# Patient Record
Sex: Female | Born: 1995 | Race: Black or African American | Hispanic: No | Marital: Married | Smoking: Never smoker
Health system: Southern US, Community
[De-identification: ages and names within clinical notes are randomized; demographics above are authoritative.]

## PROBLEM LIST (undated history)

## (undated) DIAGNOSIS — R102 Pelvic and perineal pain unspecified side: Secondary | ICD-10-CM

## (undated) DIAGNOSIS — K2970 Gastritis, unspecified, without bleeding: Secondary | ICD-10-CM

## (undated) DIAGNOSIS — K219 Gastro-esophageal reflux disease without esophagitis: Secondary | ICD-10-CM

## (undated) DIAGNOSIS — N946 Dysmenorrhea, unspecified: Secondary | ICD-10-CM

## (undated) DIAGNOSIS — K760 Fatty (change of) liver, not elsewhere classified: Secondary | ICD-10-CM

## (undated) DIAGNOSIS — Z309 Encounter for contraceptive management, unspecified: Secondary | ICD-10-CM

## (undated) DIAGNOSIS — N926 Irregular menstruation, unspecified: Secondary | ICD-10-CM

## (undated) HISTORY — DX: Fatty (change of) liver, not elsewhere classified: K76.0

## (undated) HISTORY — DX: Pelvic and perineal pain: R10.2

## (undated) HISTORY — DX: Pelvic and perineal pain unspecified side: R10.20

## (undated) HISTORY — DX: Gastro-esophageal reflux disease without esophagitis: K21.9

## (undated) HISTORY — DX: Dysmenorrhea, unspecified: N94.6

## (undated) HISTORY — DX: Irregular menstruation, unspecified: N92.6

## (undated) HISTORY — PX: NO PAST SURGERIES: SHX2092

## (undated) HISTORY — DX: Encounter for contraceptive management, unspecified: Z30.9

---
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
## 2014-04-24 ENCOUNTER — Emergency Department (HOSPITAL_COMMUNITY)
Admission: EM | Admit: 2014-04-24 | Discharge: 2014-04-24 | Disposition: A | Discharge status: Home / Self Care | Payer: 59 | Source: Home / Self Care | Attending: Family Medicine | Admitting: Family Medicine

## 2014-04-24 ENCOUNTER — Encounter (HOSPITAL_COMMUNITY): Payer: Self-pay | Admitting: Emergency Medicine

## 2014-04-24 DIAGNOSIS — R258 Other abnormal involuntary movements: Secondary | ICD-10-CM

## 2014-04-24 DIAGNOSIS — R519 Headache, unspecified: Secondary | ICD-10-CM

## 2014-04-24 DIAGNOSIS — R591 Generalized enlarged lymph nodes: Secondary | ICD-10-CM

## 2014-04-24 DIAGNOSIS — R251 Tremor, unspecified: Secondary | ICD-10-CM

## 2014-04-24 DIAGNOSIS — H1010 Acute atopic conjunctivitis, unspecified eye: Secondary | ICD-10-CM

## 2014-04-24 DIAGNOSIS — J302 Other seasonal allergic rhinitis: Secondary | ICD-10-CM

## 2014-04-24 DIAGNOSIS — R51 Headache: Secondary | ICD-10-CM

## 2014-04-24 LAB — CBC WITH DIFFERENTIAL/PLATELET
Basophils Absolute: 0 10*3/uL (ref 0.0–0.1)
Basophils Relative: 1 % (ref 0–1)
Eosinophils Absolute: 0.2 10*3/uL (ref 0.0–0.7)
Eosinophils Relative: 3 % (ref 0–5)
HCT: 40.4 % (ref 36.0–46.0)
Hemoglobin: 13.9 g/dL (ref 12.0–15.0)
Lymphocytes Relative: 33 % (ref 12–46)
Lymphs Abs: 2.2 10*3/uL (ref 0.7–4.0)
MCH: 29.6 pg (ref 26.0–34.0)
MCHC: 34.4 g/dL (ref 30.0–36.0)
MCV: 86 fL (ref 78.0–100.0)
Monocytes Absolute: 0.5 10*3/uL (ref 0.1–1.0)
Monocytes Relative: 7 % (ref 3–12)
Neutro Abs: 3.8 10*3/uL (ref 1.7–7.7)
Neutrophils Relative %: 56 % (ref 43–77)
Platelets: 388 10*3/uL (ref 150–400)
RBC: 4.7 MIL/uL (ref 3.87–5.11)
RDW: 13.6 % (ref 11.5–15.5)
WBC: 6.7 10*3/uL (ref 4.0–10.5)

## 2014-04-24 MED ORDER — OLOPATADINE HCL 0.2 % OP SOLN: OPHTHALMIC | Status: DC

## 2014-04-24 MED ORDER — FLUTICASONE PROPIONATE 50 MCG/ACT NA SUSP: 2.0000 | Freq: Two times a day (BID) | NASAL | Status: DC

## 2014-04-24 MED ORDER — FEXOFENADINE-PSEUDOEPHED ER 60-120 MG PO TB12: 1.0000 | ORAL_TABLET | Freq: Two times a day (BID) | ORAL | Status: DC

## 2014-04-24 NOTE — ED Notes (Addendum)
Pt  Has    Multiple  Complaints    She  Reports  A  Knot  In  Front  Of  r  Ear   As  Well  As   weakness  r  Arm         And  Pain l side  Head    She  Reports  These symptoms  For  Over 1 month    She  Reports   -    As   Well   That       She  Has  A  Red  Irritated r  Eye andher  Period is  irreg   But  She  Has  Been abstinent

## 2014-04-24 NOTE — ED Provider Notes (Signed)
CSN: [id number]     Arrival date & time 04/24/14  1500 History   None    Chief Complaint  Patient presents with  . Extremity Weakness   (Consider location/radiation/quality/duration/timing/severity/associated sxs/prior Treatment) HPI        18 year old female presents for evaluation of a tender knot in the front of her right ear for 1 month, intermittent headaches in the left parietal area for 1 month, and a strange feeling in her right arm for 2 months. She describes feeling in her arm as being somewhat like a tremor but on the inside of her arm, and feeling like it is not actually. The knot has been persistent, every day, in front of the right ear. It has gotten slightly larger in size. No pain in the ear itself. No sore throat, cough. She has had some congestion. Also she complains of right eye irritation for about a week and a half. She describes this as feeling like her eyes are slightly burning and dry. She tried treating this with Visine drops without relief. She does have a doctor, she did not think to go there with this problem.  History reviewed. No pertinent past medical history. History reviewed. No pertinent past surgical history. History reviewed. No pertinent family history. History  Substance Use Topics  . Smoking status: Never Smoker   . Smokeless tobacco: Not on file  . Alcohol Use: No   OB History   Grav Para Term Preterm Abortions TAB SAB Ect Mult Living                 Review of Systems  Constitutional: Negative for fever and chills.  HENT: Positive for congestion, rhinorrhea and sore throat.   Eyes: Positive for pain, redness and itching. Negative for discharge and visual disturbance.  Respiratory: Positive for cough.   Musculoskeletal:       Strange feeling in the right arm  Neurological: Positive for tremors, numbness and headaches.  Hematological: Positive for adenopathy.  All other systems reviewed and are negative.   Allergies  Review of patient's  allergies indicates no known allergies.  Home Medications   Prior to Admission medications   Medication Sig Start Date End Date Taking? Authorizing Provider  cephALEXin (KEFLEX) 500 MG capsule Take 1 capsule (500 mg total) by mouth 4 (four) times daily. 01/15/13   Adlih Moreno-Coll, MD  fexofenadine-pseudoephedrine (ALLEGRA-D) 60-120 MG per tablet Take 1 tablet by mouth every 12 (twelve) hours. 04/24/14   Adrian BlackwaterZachary H Mahrukh Seguin, PA-C  fluticasone (FLONASE) 50 MCG/ACT nasal spray Place 2 sprays into both nostrils daily. 08/23/13   Cathlyn ParsonsAngela M Kabbe, NP  fluticasone (FLONASE) 50 MCG/ACT nasal spray Place 2 sprays into both nostrils 2 (two) times daily. Decrease to 2 sprays/nostril daily after 5 days 04/24/14   Graylon GoodZachary H Jaanai Salemi, PA-C  Olopatadine HCl (PATADAY) 0.2 % SOLN 1 drop per eye once daily as needed for redness, itching, or irritation 04/24/14   Graylon GoodZachary H Unique Searfoss, PA-C  phenazopyridine (PYRIDIUM) 200 MG tablet Take 1 tablet (200 mg total) by mouth 3 (three) times daily. 01/15/13   Adlih Moreno-Coll, MD   BP 135/66  Pulse 118  Temp(Src) 98.5 F (36.9 C) (Oral)  Resp 16  SpO2 98%  LMP 02/24/2014 Physical Exam  Nursing note and vitals reviewed. Constitutional: She is oriented to person, place, and time. Vital signs are normal. She appears well-developed and well-nourished. No distress.  HENT:  Head: Normocephalic and atraumatic.  Right Ear: External ear normal.  Left Ear: External  ear normal.  Nose: Nose normal.  Mouth/Throat: Oropharynx is clear and moist. No oropharyngeal exudate.  Eyes: Right eye exhibits no discharge. Left eye exhibits no discharge. Right conjunctiva is injected. Left conjunctiva is not injected.  Neck: Normal range of motion. Neck supple.  Cardiovascular: Regular rhythm, normal heart sounds and intact distal pulses.  Tachycardia present.   Pulse rechecked manually, was 104 bpm  Pulmonary/Chest: Effort normal and breath sounds normal. No respiratory distress. She has no  wheezes. She has no rales.  Abdominal: Soft. She exhibits no mass. There is no tenderness. There is no rebound and no guarding.  Lymphadenopathy:    She has cervical adenopathy (right tonsillar, right anterior auricular).  Neurological: She is alert and oriented to person, place, and time. She has normal strength. Coordination normal.  Skin: Skin is warm and dry. No rash noted. She is not diaphoretic.  Psychiatric: She has a normal mood and affect. Judgment normal.    ED Course  Procedures (including critical care time) Labs Review Labs Reviewed  CBC WITH DIFFERENTIAL    Imaging Review No results found.   MDM   1. Lymphadenopathy   2. Allergic conjunctivitis, unspecified laterality   3. Headache disorder   4. Occasional tremors   5. Seasonal allergies    No leukocytosis. We'll treat for allergies. Follow-up with primary care for evaluation of the tremor.   Meds ordered this encounter  Medications  . Olopatadine HCl (PATADAY) 0.2 % SOLN    Sig: 1 drop per eye once daily as needed for redness, itching, or irritation    Dispense:  2.5 mL    Refill:  0    Order Specific Question:  Supervising Provider    Answer:  Linna HoffKINDL, JAMES D [phone][[id number]]  . fluticasone (FLONASE) 50 MCG/ACT nasal spray    Sig: Place 2 sprays into both nostrils 2 (two) times daily. Decrease to 2 sprays/nostril daily after 5 days    Dispense:  16 g    Refill:  2    Order Specific Question:  Supervising Provider    Answer:  Linna HoffKINDL, JAMES D [phone][[id number]]  . fexofenadine-pseudoephedrine (ALLEGRA-D) 60-120 MG per tablet    Sig: Take 1 tablet by mouth every 12 (twelve) hours.    Dispense:  30 tablet    Refill:  0    Order Specific Question:  Supervising Provider    Answer:  Bradd CanaryKINDL, JAMES D [[id number]]       Graylon GoodZachary H Laurier Jasperson, PA-C 04/24/14 [id number]

## 2014-04-24 NOTE — ED Provider Notes (Signed)
Medical screening examination/treatment/procedure(s) were performed by resident physician or non-physician practitioner and as supervising physician I was immediately available for consultation/collaboration.   Barkley BrunsKINDL,Golden Gilreath DOUGLAS MD.   Linna HoffJames D Saverio Kader, MD 04/24/14 [phone]

## 2014-04-24 NOTE — Discharge Instructions (Signed)

## 2015-06-07 ENCOUNTER — Ambulatory Visit (HOSPITAL_COMMUNITY)
Admission: RE | Admit: 2015-06-07 | Discharge: 2015-06-07 | Disposition: A | Discharge status: Home / Self Care | Payer: 59 | Source: Ambulatory Visit | Attending: Family Medicine | Admitting: Family Medicine

## 2015-06-07 ENCOUNTER — Ambulatory Visit (HOSPITAL_COMMUNITY): Payer: 59

## 2015-06-07 ENCOUNTER — Other Ambulatory Visit (HOSPITAL_COMMUNITY): Payer: Self-pay | Admitting: Family Medicine

## 2015-06-07 DIAGNOSIS — N83201 Unspecified ovarian cyst, right side: Secondary | ICD-10-CM

## 2015-06-08 ENCOUNTER — Ambulatory Visit (HOSPITAL_COMMUNITY): Admission: RE | Admit: 2015-06-08 | Payer: 59 | Source: Ambulatory Visit

## 2015-06-08 ENCOUNTER — Ambulatory Visit (HOSPITAL_COMMUNITY): Payer: 59

## 2015-06-08 ENCOUNTER — Ambulatory Visit (HOSPITAL_COMMUNITY)
Admission: RE | Admit: 2015-06-08 | Discharge: 2015-06-08 | Disposition: A | Discharge status: Home / Self Care | Payer: 59 | Source: Ambulatory Visit | Attending: Family Medicine | Admitting: Family Medicine

## 2015-06-08 DIAGNOSIS — N83201 Unspecified ovarian cyst, right side: Secondary | ICD-10-CM | POA: Diagnosis not present

## 2015-06-08 DIAGNOSIS — R1031 Right lower quadrant pain: Secondary | ICD-10-CM | POA: Insufficient documentation

## 2015-07-30 DIAGNOSIS — Z6839 Body mass index (BMI) 39.0-39.9, adult: Secondary | ICD-10-CM | POA: Diagnosis not present

## 2015-07-30 DIAGNOSIS — E6609 Other obesity due to excess calories: Secondary | ICD-10-CM | POA: Diagnosis not present

## 2015-08-10 ENCOUNTER — Encounter: Payer: Self-pay | Admitting: Adult Health

## 2015-08-10 ENCOUNTER — Encounter: Payer: Self-pay | Admitting: *Deleted

## 2015-08-10 ENCOUNTER — Ambulatory Visit (INDEPENDENT_AMBULATORY_CARE_PROVIDER_SITE_OTHER): Payer: 59 | Admitting: Adult Health

## 2015-08-10 VITALS — BP 140/78 | HR 94 | Ht 63.25 in | Wt 228.0 lb

## 2015-08-10 DIAGNOSIS — N946 Dysmenorrhea, unspecified: Secondary | ICD-10-CM

## 2015-08-10 DIAGNOSIS — N926 Irregular menstruation, unspecified: Secondary | ICD-10-CM | POA: Diagnosis not present

## 2015-08-10 DIAGNOSIS — Z3202 Encounter for pregnancy test, result negative: Secondary | ICD-10-CM

## 2015-08-10 HISTORY — DX: Irregular menstruation, unspecified: N92.6

## 2015-08-10 HISTORY — DX: Dysmenorrhea, unspecified: N94.6

## 2015-08-10 LAB — POCT URINE PREGNANCY: Preg Test, Ur: NEGATIVE

## 2015-08-10 MED ORDER — NORETHIN-ETH ESTRAD-FE BIPHAS 1 MG-10 MCG / 10 MCG PO TABS: 1.0000 | ORAL_TABLET | Freq: Every day | ORAL | Status: DC

## 2015-08-10 NOTE — Patient Instructions (Signed)
Oral Contraception Use Oral contraceptive pills (OCPs) are medicines taken to prevent pregnancy. OCPs work by preventing the ovaries from releasing eggs. The hormones in OCPs also cause the cervical mucus to thicken, preventing the sperm from entering the uterus. The hormones also cause the uterine lining to become thin, not allowing a fertilized egg to attach to the inside of the uterus. OCPs are highly effective when taken exactly as prescribed. However, OCPs do not prevent sexually transmitted diseases (STDs). Safe sex practices, such as using condoms along with an OCP, can help prevent STDs. Before taking OCPs, you may have a physical exam and Pap test. Your health care provider may also order blood tests if necessary. Your health care provider will make sure you are a good candidate for oral contraception. Discuss with your health care provider the possible side effects of the OCP you may be prescribed. When starting an OCP, it can take 2 to 3 months for the body to adjust to the changes in hormone levels in your body.  HOW TO TAKE ORAL CONTRACEPTIVE PILLS Your health care provider may advise you on how to start taking the first cycle of OCPs. Otherwise, you can:   Start on day 1 of your menstrual period. You will not need any backup contraceptive protection with this start time.   Start on the first Sunday after your menstrual period or the day you get your prescription. In these cases, you will need to use backup contraceptive protection for the first week.   Start the pill at any time of your cycle. If you take the pill within 5 days of the start of your period, you are protected against pregnancy right away. In this case, you will not need a backup form of birth control. If you start at any other time of your menstrual cycle, you will need to use another form of birth control for 7 days. If your OCP is the type called a minipill, it will protect you from pregnancy after taking it for 2 days (48  hours). After you have started taking OCPs:   If you forget to take 1 pill, take it as soon as you remember. Take the next pill at the regular time.   If you miss 2 or more pills, call your health care provider because different pills have different instructions for missed doses. Use backup birth control until your next menstrual period starts.   If you use a 28-day pack that contains inactive pills and you miss 1 of the last 7 pills (pills with no hormones), it will not matter. Throw away the rest of the non-hormone pills and start a new pill pack.  No matter which day you start the OCP, you will always start a new pack on that same day of the week. Have an extra pack of OCPs and a backup contraceptive method available in case you miss some pills or lose your OCP pack.  HOME CARE INSTRUCTIONS   Do not smoke.   Always use a condom to protect against STDs. OCPs do not protect against STDs.   Use a calendar to mark your menstrual period days.   Read the information and directions that came with your OCP. Talk to your health care provider if you have questions.  SEEK MEDICAL CARE IF:   You develop nausea and vomiting.   You have abnormal vaginal discharge or bleeding.   You develop a rash.   You miss your menstrual period.   You are losing   your hair.   You need treatment for mood swings or depression.   You get dizzy when taking the OCP.   You develop acne from taking the OCP.   You become pregnant.  SEEK IMMEDIATE MEDICAL CARE IF:   You develop chest pain.   You develop shortness of breath.   You have an uncontrolled or severe headache.   You develop numbness or slurred speech.   You develop visual problems.   You develop pain, redness, and swelling in the legs.    This information is not intended to replace advice given to you by your health care provider. Make sure you discuss any questions you have with your health care provider.   Document  Released: 06/01/2011 Document Revised: 07/03/2014 Document Reviewed: 12/01/2012 Elsevier Interactive Patient Education 2016 [employer]. Start lo loestrin today If has sex use condoms  Follow up in 3 months for  Pap and physical

## 2015-08-10 NOTE — Progress Notes (Signed)
Subjective:     Patient ID: Jordan Jackson, female   DOB: Jun 09, 1996, 20 y.o.   MRN: [id number]  HPI Jordan Jackson is a 20 year old black female, referred by Dr Parke Simmers for irregular periods.She started at age 20-12 and periods were regular til 2014 then started skipping periods, and length went from about 3 days to 7-8 days and started with cramps.She has not had period since October.She had normal labs and normal gyn [place] 06/08/15.She has had pain in right side since November.  Review of Systems Patient denies any headaches, hearing loss, fatigue, blurred vision, shortness of breath, chest pain, problems with bowel movements, urination, or intercourse(not having sex). No joint pain or mood swings. See HPI for positives. Reviewed past medical,surgical, social and family history. Reviewed medications and allergies.     Objective:   Physical Exam BP 140/78 mmHg  Pulse 94  Ht 5' 3.25" (1.607 m)  Wt 228 lb (103.42 kg)  BMI 40.05 kg/m2UPT negative, Skin warm and dry. Neck: mid line trachea, normal thyroid, good ROM, no lymphadenopathy noted. Lungs: clear to ausculation bilaterally. Cardiovascular: regular rate and rhythm.   Pelvic: external genitalia is normal in appearance no lesions, vagina: scant tan discharge without odor,urethra has no lesions or masses noted, cervix:smooth, uterus: normal size, shape and contour, non tender, no masses felt, adnexa: no masses or tenderness noted. Bladder is non tender and no masses felt. Discussed trying low dose OC and she agrees, she is aware of benefits and risk.  Face time 20 minutes.  Assessment:     Irregular periods Period cramps    Plan:     Rx lo loestrin disp 1 pack take 1 daily with 11 refills Follow up in 3 months for ROS and pap and physical If has sex use condoms

## 2015-11-01 DIAGNOSIS — Z6834 Body mass index (BMI) 34.0-34.9, adult: Secondary | ICD-10-CM | POA: Diagnosis not present

## 2015-11-01 DIAGNOSIS — L259 Unspecified contact dermatitis, unspecified cause: Secondary | ICD-10-CM | POA: Diagnosis not present

## 2015-11-01 DIAGNOSIS — E6609 Other obesity due to excess calories: Secondary | ICD-10-CM | POA: Diagnosis not present

## 2015-11-01 DIAGNOSIS — R7309 Other abnormal glucose: Secondary | ICD-10-CM | POA: Diagnosis not present

## 2015-11-09 ENCOUNTER — Other Ambulatory Visit: Payer: 59 | Admitting: Adult Health

## 2015-11-15 ENCOUNTER — Other Ambulatory Visit (HOSPITAL_COMMUNITY)
Admission: RE | Admit: 2015-11-15 | Discharge: 2015-11-15 | Disposition: A | Discharge status: Home / Self Care | Payer: 59 | Source: Ambulatory Visit | Attending: Adult Health | Admitting: Adult Health

## 2015-11-15 ENCOUNTER — Ambulatory Visit (INDEPENDENT_AMBULATORY_CARE_PROVIDER_SITE_OTHER): Payer: 59 | Admitting: Adult Health

## 2015-11-15 ENCOUNTER — Encounter: Payer: Self-pay | Admitting: Adult Health

## 2015-11-15 VITALS — BP 122/80 | HR 74 | Ht 64.0 in | Wt 214.0 lb

## 2015-11-15 DIAGNOSIS — Z309 Encounter for contraceptive management, unspecified: Secondary | ICD-10-CM | POA: Insufficient documentation

## 2015-11-15 DIAGNOSIS — Z01419 Encounter for gynecological examination (general) (routine) without abnormal findings: Secondary | ICD-10-CM | POA: Insufficient documentation

## 2015-11-15 DIAGNOSIS — Z113 Encounter for screening for infections with a predominantly sexual mode of transmission: Secondary | ICD-10-CM | POA: Diagnosis present

## 2015-11-15 DIAGNOSIS — Z3041 Encounter for surveillance of contraceptive pills: Secondary | ICD-10-CM

## 2015-11-15 HISTORY — DX: Encounter for contraceptive management, unspecified: Z30.9

## 2015-11-15 MED ORDER — NORETHIN-ETH ESTRAD-FE BIPHAS 1 MG-10 MCG / 10 MCG PO TABS: 1.0000 | ORAL_TABLET | Freq: Every day | ORAL | Status: DC

## 2015-11-15 NOTE — Patient Instructions (Signed)
Start lo loestrin back with period Use condoms if has sex Physical in  1 year

## 2015-11-15 NOTE — Progress Notes (Signed)
Patient ID: Jordan Jackson, female   DOB: 05/27/1996, 20 y.o.   MRN: [id number] History of Present Illness: Jordan Jackson is a 20 year old black female in for well woman gyn exam and pap, this is her first.She took lo loestrin for 2 months but did not have money for this month and stopped, but she wants to resume them.She is not currently having sex.She still has pain on and off in right side, was better on OCs.She has lost 14 lbs since February and has seen Dr Parke SimmersBland for labs. PCP is Dr Parke SimmersBland.   Current Medications, Allergies, Past Medical History, Past Surgical History, Family History and Social History were reviewed in [facility] electronic medical record.     Review of Systems: Patient denies any headaches, hearing loss, fatigue, blurred vision, shortness of breath, chest pain, problems with bowel movements, urination, or intercourse(not currently). No joint pain or mood swings. See HPI for positives.   Physical Exam:BP 122/80 mmHg  Pulse 74  Ht 5\' 4"  (1.626 m)  Wt 214 lb (97.07 kg)  BMI 36.72 kg/m2  LMP 10/22/2015 General:  Well developed, well nourished, no acute distress Skin:  Warm and dry,no rashes Neck:  Midline trachea, normal thyroid, good ROM, no lymphadenopathy Lungs; Clear to auscultation bilaterally Breast:  No dominant palpable mass, retraction, or nipple discharge Cardiovascular: Regular rate and rhythm Abdomen:  Soft, non tender, no hepatosplenomegaly Pelvic:  External genitalia is normal in appearance, no lesions.  The vagina is normal in appearance. Urethra has no lesions or masses. The cervix is smooth, pap with GC/CHL performed.  Uterus is felt to be normal size, shape, and contour.  No adnexal masses or tenderness noted.Bladder is non tender, no masses felt. Extremities/musculoskeletal:  No swelling or varicosities noted, no clubbing or cyanosis Psych:  No mood changes, alert and cooperative,seems happy   Impression: Well woman gyn exam and pap Contraceptive  management    Plan: Refilled lo loestrin Disp 3 packs take 1 daily, with 4 refills, start pills back with next period Use condoms if has sex Physical in 1 year, pap in 3 if normal

## 2015-11-17 LAB — CYTOLOGY - PAP

## 2015-12-01 DIAGNOSIS — Z111 Encounter for screening for respiratory tuberculosis: Secondary | ICD-10-CM | POA: Diagnosis not present

## 2015-12-02 DIAGNOSIS — Z0000 Encounter for general adult medical examination without abnormal findings: Secondary | ICD-10-CM | POA: Diagnosis not present

## 2015-12-02 DIAGNOSIS — R7309 Other abnormal glucose: Secondary | ICD-10-CM | POA: Diagnosis not present

## 2015-12-02 DIAGNOSIS — E6609 Other obesity due to excess calories: Secondary | ICD-10-CM | POA: Diagnosis not present

## 2015-12-14 ENCOUNTER — Telehealth: Payer: Self-pay | Admitting: Adult Health

## 2015-12-14 NOTE — Telephone Encounter (Signed)
Pt states Jordan MourningJennifer Griffin, NP has instructed her to start her Loloestrin when she started her period. Pt states she has not started a period does she need to go ahead and start the Loloestrin?

## 2015-12-14 NOTE — Telephone Encounter (Signed)
Has not started period yet, if none by next week make appt for UPT

## 2015-12-21 ENCOUNTER — Telehealth: Payer: Self-pay | Admitting: Adult Health

## 2015-12-21 NOTE — Telephone Encounter (Signed)
Spoke with pt. Pt still hasn't started period so she hasn't started birth control pills. Pt states she knows for a fact she's not pregnant. I advised she would still need to come in for pregnancy test since she hasn't started period before she could start pills since it's been about 4 weeks since she was seen. If pt starts period, no need for pregnancy test before starting pills. Pt to check schedule and call back for appt. JSY

## 2016-04-03 DIAGNOSIS — R7309 Other abnormal glucose: Secondary | ICD-10-CM | POA: Diagnosis not present

## 2016-04-03 DIAGNOSIS — E6609 Other obesity due to excess calories: Secondary | ICD-10-CM | POA: Diagnosis not present

## 2016-05-06 LAB — BASIC METABOLIC PANEL: Glucose: 111 mg/dL

## 2016-06-21 ENCOUNTER — Encounter: Payer: Self-pay | Admitting: Adult Health

## 2016-06-21 ENCOUNTER — Ambulatory Visit (INDEPENDENT_AMBULATORY_CARE_PROVIDER_SITE_OTHER): Payer: 59 | Admitting: Adult Health

## 2016-06-21 VITALS — BP 128/74 | HR 86 | Ht 63.0 in | Wt 219.0 lb

## 2016-06-21 DIAGNOSIS — N926 Irregular menstruation, unspecified: Secondary | ICD-10-CM

## 2016-06-21 DIAGNOSIS — Z3041 Encounter for surveillance of contraceptive pills: Secondary | ICD-10-CM | POA: Diagnosis not present

## 2016-06-21 DIAGNOSIS — Z3202 Encounter for pregnancy test, result negative: Secondary | ICD-10-CM

## 2016-06-21 DIAGNOSIS — R1031 Right lower quadrant pain: Secondary | ICD-10-CM

## 2016-06-21 LAB — POCT URINE PREGNANCY: Preg Test, Ur: NEGATIVE

## 2016-06-21 MED ORDER — NORETHIN-ETH ESTRAD-FE BIPHAS 1 MG-10 MCG / 10 MCG PO TABS: 1.0000 | ORAL_TABLET | Freq: Every day | ORAL | 4 refills | Status: DC

## 2016-06-21 NOTE — Progress Notes (Signed)
Subjective:     Patient ID: Jordan Jackson, female   DOB: 08/27/1995, 20 y.o.   MRN: [id number]  HPI Jordan Jackson is a 20 year old black female, had missed a period on OCs, and had pain in right side so she stopped her pills.  Review of Systems Irregular period Pain in right side Reviewed past medical,surgical, social and family history. Reviewed medications and allergies.     Objective:   Physical Exam BP 128/74 (BP Location: Left Arm, Patient Position: Sitting, Cuff Size: Large)   Pulse 86   Ht 5\' 3"  (1.6 m)   Wt 219 lb (99.3 kg)   BMI 38.79 kg/m UPT negative. PHQ 2 score 0. Skin warm and dry.Pelvic: external genitalia is normal in appearance no lesions, vagina: white discharge without odor,urethra has no lesions or masses noted, cervix:smooth, uterus: normal size, shape and contour, non tender, no masses felt, adnexa: no masses, some RLQ tenderness noted. Bladder is non tender and no masses felt. She declines GC/CHL.   Discussed probable cyst, resume OCs.Also discussed it is Ok to skip a period when on the pill.  Assessment:     1. RLQ abdominal pain   2. Irregular periods/menstrual cycles   3. Pregnancy examination or test, negative result   4. Encounter for surveillance of contraceptive pills       Plan:     Meds ordered this encounter  Medications  . Norethindrone-Ethinyl Estradiol-Fe Biphas (LO LOESTRIN FE) 1 MG-10 MCG / 10 MCG tablet    Sig: Take 1 tablet by mouth daily. Take 1 daily by mouth    Dispense:  3 Package    Refill:  4    BIN [id number], PCN [id number], GRP [id number],ID [id number]    Order Specific Question:   Supervising Provider    Answer:   Duane LopeEURE, LUTHER H [[id number]]     Follow up prn

## 2016-06-21 NOTE — Patient Instructions (Signed)
Follow up prn

## 2016-10-02 ENCOUNTER — Ambulatory Visit: Payer: 59 | Admitting: Adult Health

## 2016-10-19 ENCOUNTER — Encounter: Payer: Self-pay | Admitting: Adult Health

## 2016-10-19 ENCOUNTER — Ambulatory Visit (INDEPENDENT_AMBULATORY_CARE_PROVIDER_SITE_OTHER): Payer: 59 | Admitting: Adult Health

## 2016-10-19 VITALS — BP 118/80 | HR 100 | Ht 63.0 in | Wt 236.0 lb

## 2016-10-19 DIAGNOSIS — R1011 Right upper quadrant pain: Secondary | ICD-10-CM | POA: Diagnosis not present

## 2016-10-19 DIAGNOSIS — R1031 Right lower quadrant pain: Secondary | ICD-10-CM | POA: Diagnosis not present

## 2016-10-19 NOTE — Progress Notes (Signed)
Subjective:     Patient ID: Jordan Jackson, female   DOB: 10/22/1995, 21 y.o.   MRN: [id number]  HPI [name] is a 21 year old black female in complaining of RLQ pain, has had ovarian cyst in the past, she is taking lo loestrin.And has pain RUQ and heartburn and some nausea. 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   Review of Systems RLQ pain  RUQ pain  Heartburn, some nausea Reviewed past medical,surgical, social and family history. Reviewed medications and allergies.     Objective:   Physical Exam BP 118/80 (BP Location: Left Arm, Patient Position: Sitting, Cuff Size: Large)   Pulse 100   Ht  (1.6 m)   Wt 236 lb (107 kg)   LMP 10/06/2016   BMI 41.81 kg/m    Skin warm and dry.Abdomen is soft and has some RUQ tenderness. Pelvic: external genitalia is normal in appearance no lesions, vagina: pink with good moisture and rugae,urethra has no lesions or masses noted, cervix:smooth, uterus: normal size, shape and contour, non tender, no masses felt, adnexa: no masses, RLQ tenderness noted. Bladder is non tender and no masses felt. Will get abd/pelvic US.  Assessment:     1. RLQ abdominal pain   2. RUQ pain       Plan:   Continue OCs Abdominal US 10/20/16 at 7:30 am at [facility], NPO Pelvic US 4/30 at 3:30 at [facility] with full bladder Will talk next week when results back

## 2016-10-20 ENCOUNTER — Ambulatory Visit (HOSPITAL_COMMUNITY)
Admission: RE | Admit: 2016-10-20 | Discharge: 2016-10-20 | Disposition: A | Discharge status: Home / Self Care | Payer: 59 | Source: Ambulatory Visit | Attending: Adult Health | Admitting: Adult Health

## 2016-10-20 DIAGNOSIS — R1011 Right upper quadrant pain: Secondary | ICD-10-CM | POA: Diagnosis present

## 2016-10-20 DIAGNOSIS — R932 Abnormal findings on diagnostic imaging of liver and biliary tract: Secondary | ICD-10-CM | POA: Diagnosis not present

## 2016-10-23 ENCOUNTER — Ambulatory Visit (HOSPITAL_COMMUNITY)
Admission: RE | Admit: 2016-10-23 | Discharge: 2016-10-23 | Disposition: A | Discharge status: Home / Self Care | Payer: 59 | Source: Ambulatory Visit | Attending: Adult Health | Admitting: Adult Health

## 2016-10-23 DIAGNOSIS — R1031 Right lower quadrant pain: Secondary | ICD-10-CM | POA: Insufficient documentation

## 2016-10-24 ENCOUNTER — Encounter: Payer: Self-pay | Admitting: Adult Health

## 2016-10-24 ENCOUNTER — Telehealth: Payer: Self-pay | Admitting: Adult Health

## 2016-10-24 DIAGNOSIS — K760 Fatty (change of) liver, not elsewhere classified: Secondary | ICD-10-CM

## 2016-10-24 HISTORY — DX: Fatty (change of) liver, not elsewhere classified: K76.0

## 2016-10-24 NOTE — Telephone Encounter (Signed)
Pt aware of [place] results and that referral made to GI for fatty liver

## 2016-10-24 NOTE — Telephone Encounter (Signed)
Left message that Abdominal US showed fatty liver, will refer to GI, and pelvic US normal.Referral sent to [facility], after I spoke with Verlon Au.

## 2016-10-26 ENCOUNTER — Telehealth: Payer: Self-pay

## 2016-10-26 NOTE — Telephone Encounter (Signed)
Pt called to see status of referral for an appt for fatty liver. Forwarding to Darl PikesSusan to check up on it.

## 2016-10-27 NOTE — Telephone Encounter (Signed)
The new patient referral is in [facility] office waiting to get approval

## 2016-10-30 ENCOUNTER — Encounter: Payer: Self-pay | Admitting: Gastroenterology

## 2016-11-16 ENCOUNTER — Ambulatory Visit: Payer: 59 | Admitting: Nurse Practitioner

## 2016-11-27 ENCOUNTER — Ambulatory Visit: Payer: 59 | Admitting: Nurse Practitioner

## 2016-11-27 DIAGNOSIS — Z111 Encounter for screening for respiratory tuberculosis: Secondary | ICD-10-CM | POA: Diagnosis not present

## 2016-12-08 ENCOUNTER — Other Ambulatory Visit: Payer: Self-pay | Admitting: Adult Health

## 2016-12-08 ENCOUNTER — Encounter: Payer: Self-pay | Admitting: Gastroenterology

## 2016-12-08 ENCOUNTER — Ambulatory Visit (INDEPENDENT_AMBULATORY_CARE_PROVIDER_SITE_OTHER): Payer: 59 | Admitting: Gastroenterology

## 2016-12-08 VITALS — BP 153/87 | HR 80 | Temp 97.7°F | Ht 63.0 in | Wt 235.2 lb

## 2016-12-08 DIAGNOSIS — R1011 Right upper quadrant pain: Secondary | ICD-10-CM | POA: Diagnosis not present

## 2016-12-08 DIAGNOSIS — K219 Gastro-esophageal reflux disease without esophagitis: Secondary | ICD-10-CM | POA: Diagnosis not present

## 2016-12-08 DIAGNOSIS — G8929 Other chronic pain: Secondary | ICD-10-CM | POA: Diagnosis not present

## 2016-12-08 DIAGNOSIS — K760 Fatty (change of) liver, not elsewhere classified: Secondary | ICD-10-CM

## 2016-12-08 DIAGNOSIS — R198 Other specified symptoms and signs involving the digestive system and abdomen: Secondary | ICD-10-CM | POA: Diagnosis not present

## 2016-12-08 DIAGNOSIS — K625 Hemorrhage of anus and rectum: Secondary | ICD-10-CM

## 2016-12-08 MED ORDER — PANTOPRAZOLE SODIUM 40 MG PO TBEC: 40.0000 mg | DELAYED_RELEASE_TABLET | Freq: Every day | ORAL | 5 refills | Status: DC

## 2016-12-08 NOTE — Patient Instructions (Signed)
1. Please have your labs done.  2. Start pantoprazole once daily before breakfast. RX sent to [facility]. 3. Start Restora once daily for 2 weeks. Samples provided.    Fatty Liver Fatty liver, also called hepatic steatosis or steatohepatitis, is a condition in which too much fat has built up in your liver cells. The liver removes harmful substances from your bloodstream. It produces fluids your body needs. It also helps your body use and store energy from the food you eat. In many cases, fatty liver does not cause symptoms or problems. It is often diagnosed when tests are being done for other reasons. However, over time, fatty liver can cause inflammation that may lead to more serious liver problems, such as scarring of the liver (cirrhosis). What are the causes? Causes of fatty liver may include:  Drinking too much alcohol.  Poor nutrition.  Obesity.  Cushing syndrome.  Diabetes.  Hyperlipidemia.  Pregnancy.  Certain drugs.  Poisons.  Some viral infections.  What increases the risk? You may be more likely to develop fatty liver if you:  Abuse alcohol.  Are pregnant.  Are overweight.  Have diabetes.  Have hepatitis.  Have a high triglyceride level.  What are the signs or symptoms? Fatty liver often does not cause any symptoms. In cases where symptoms develop, they can include:  Fatigue.  Weakness.  Weight loss.  Confusion.  Abdominal pain.  Yellowing of your skin and the white parts of your eyes (jaundice).  Nausea and vomiting.  How is this diagnosed? Fatty liver may be diagnosed by:  Physical exam and medical history.  Blood tests.  Imaging tests, such as an ultrasound, CT scan, or MRI.  Liver biopsy. A small sample of liver tissue is removed using a needle. The sample is then looked at under a microscope.  How is this treated? Fatty liver is often caused by other health conditions. Treatment for fatty liver may involve medicines and lifestyle  changes to manage conditions such as:  Alcoholism.  High cholesterol.  Diabetes.  Being overweight or obese.  Follow these instructions at home:  Eat a healthy diet as directed by your health care provider.  Exercise regularly. This can help you lose weight and control your cholesterol and diabetes. Talk to your health care provider about an exercise plan and which activities are best for you.  Do not drink alcohol.  Take medicines only as directed by your health care provider. Contact a health care provider if: You have difficulty controlling your:  Blood sugar.  Cholesterol.  Alcohol consumption.  Get help right away if:  You have abdominal pain.  You have jaundice.  You have nausea and vomiting. This information is not intended to replace advice given to you by your health care provider. Make sure you discuss any questions you have with your health care provider. Document Released: 07/28/2005 Document Revised: 11/18/2015 Document Reviewed: 10/22/2013 Elsevier Interactive Patient Education  [employer]2018 Elsevier Inc[employer]

## 2016-12-08 NOTE — Assessment & Plan Note (Signed)
Fatty liver noted on ultrasound, findings dating back almost 8 years. No recent labs. Patient reports previously her LFTs a been abnormal. Update labs. Discussed fatty liver and potential progression to cirrhosis.  Instructions for fatty liver: Recommend 1-2# weight loss per week until ideal body weight through exercise & diet. Low fat/cholesterol diet.   Avoid sweets, sodas, fruit juices, sweetened beverages like tea, etc. Gradually increase exercise from 15 min daily up to 1 hr per day 5 days/week. Limit alcohol use.

## 2016-12-08 NOTE — Assessment & Plan Note (Signed)
Likely irritable bowel. For now will add probiotic daily. Samples of Restora provided. See assessment and plan for rectal bleeding.

## 2016-12-08 NOTE — Assessment & Plan Note (Signed)
Chronic right upper quadrant pain, sometimes postprandially. Gallbladder appear normal on recent ultrasound. Not likely related to her fatty liver. Update labs. Add PPI to see if this helps. She has significant reflux symptoms. If persisting symptoms despite PPI, she may require upper endoscopy for further evaluation as a next step.

## 2016-12-08 NOTE — Assessment & Plan Note (Signed)
Chronic alternating constipation/diarrhea with intermittent rectal bleeding. Suspect IBS with possible benign anorectal source for bleeding but would consider the possibility of ileocolonoscopy in the upcoming future for further evaluation. Right now we will start probiotic. Will hold off on scheduling colonoscopy until after we determine whether her reflux is manageable with PPI therapy or if she will need to have upper endoscopy for further evaluation of that and her right upper quadrant abdominal pain. Would like to do both at the same time.

## 2016-12-08 NOTE — Progress Notes (Addendum)
REVIEWED-NO ADDITIONAL RECOMMENDATIONS.  Primary Care Physician:  Dr. Parke SimmersBland  Primary Gastroenterologist:  Jonette EvaSandi Fields, MD   Chief Complaint  Patient presents with  . FATTY LIVER    HPI:  [name] is a 21 y.o. female here at the request of Peter GarterJennifer Griffen, NP with [facility] for fatty liver. Back in April she had an ultrasound which showed no gallstones. Common bile duct 5 mm. Generalized increase in liver echogenicity likely due to hepatic steatosis. No recent labs available. Abdominal ultrasound performed to further evaluate right-sided abdominal pain. Presents with her mother today. She states she's had abdominal pain intermittently since she was a teenager. In the ninth grade she had severe right upper quadrant pain finally went away. She states her LFTs were abnormal at that time. She states PCP told her she has IBS too.   Symptoms started back again in 2016. Initially in RLQ. No periods. Seen by PCP, vaginal exam benign. Saw OB/GYN, pelvic ultrasound unremarkable. Abdominal ultrasound performed showing fatty liver. No recent labs available. Patient was placed on birth control pills. She's been on them for about a year. Continues to have intermittent pain in the right lower quadrant, feels like a pulling sensation when she urinates. Continues to have no cycles. Over time the pain started creeping up into the right upper quadrant region. Sometimes worse with meals but at other times unrelated to meals. Associated with nausea especially when sharp pain. Pain can go in few minutes or last for several hours. Can radiate into the back. Happened with hamburgers and aches. She admits to bad heartburn. In the past have been on Nexium but developed headaches. Has not been on PPI therapy and years. Uses Tums as needed. Has heartburn every day and nocturnally. Bowel function is all over the place. She alternates between loose stools and hard stools about 50% each. She does have daily bowel  movements. Has seen bright red blood mixed in the stool and on the tissue not always related to straining. No melena. No regular NSAID or aspirin use. No family history of IBD, colon cancer, liver disease.  Mother states the LFTs have been abnormal on several occasions in the past.  Patient was evaluated by Dr. Bing PlumeJoseph Clark in 2012 when she is a pediatric patient. Upper GI series showed mild reflux at the EGJ. Liver appeared normal at that time although dating back to 2010 she did have ultrasound indicating probable fatty liver.  Current Outpatient Prescriptions  Medication Sig Dispense Refill  . LO LOESTRIN FE 1 MG-10 MCG / 10 MCG tablet TAKE 1 TABLET EVERY DAY 84 tablet 0   No current facility-administered medications for this visit.     Allergies as of 12/08/2016  . (No Known Allergies)    Past Medical History:  Diagnosis Date  . Contraceptive management 11/15/2015  . Fatty liver 10/24/2016   Refer to GI  . Irregular periods/menstrual cycles 08/10/2015  . Menstrual cramps 08/10/2015  . Pelvic pain in female     No past surgical history on file. No surgery.  Family History  Problem Relation Age of Onset  . Hypertension Father   . Diabetes Maternal Grandmother   . Hypertension Paternal Grandmother   . Hypertension Paternal Grandfather   . Diabetes Paternal Grandfather     Social History   Social History  . Marital status: Single    Spouse name: N/A  . Number of children: N/A  . Years of education: N/A   Occupational History  .  nurse tech    Social History Main Topics  . Smoking status: Never Smoker  . Smokeless tobacco: Never Used  . Alcohol use No  . Drug use: No  . Sexual activity: Yes    Birth control/ protection: Pill   Other Topics Concern  . Not on file   Social History Narrative  . No narrative on file      ROS:  General: Negative for anorexia, weight loss, fever, chills, fatigue, weakness. Eyes: Negative for vision changes.  ENT: Negative for  hoarseness, difficulty swallowing , nasal congestion. CV: Negative for chest pain, angina, palpitations, dyspnea on exertion, peripheral edema.  Respiratory: Negative for dyspnea at rest, dyspnea on exertion, cough, sputum, wheezing.  GI: See history of present illness. GU:  Negative for dysuria, hematuria, urinary incontinence, urinary frequency, nocturnal urination. See hpi MS: Negative for joint pain, low back pain.  Derm: Negative for rash or itching.  Neuro: Negative for weakness, abnormal sensation, seizure, frequent headaches, memory loss, confusion.  Psych: Negative for anxiety, depression, suicidal ideation, hallucinations.  Endo: Negative for unusual weight change.  Heme: Negative for bruising or bleeding. Allergy: Negative for rash or hives.    Physical Examination:  BP (!) 153/87   Pulse 80   Temp 97.7 F (36.5 C) (Oral)   Ht 5\' 3"  (1.6 m)   Wt 235 lb 3.2 oz (106.7 kg)   LMP 11/27/2016   BMI 41.66 kg/m    General: Well-nourished, well-developed in no acute distress. Accompanied by her mother. Head: Normocephalic, atraumatic.   Eyes: Conjunctiva pink, no icterus. Mouth: Oropharyngeal mucosa moist and pink , no lesions erythema or exudate. Neck: Supple without thyromegaly, masses, or lymphadenopathy.  Lungs: Clear to auscultation bilaterally.  Heart: Regular rate and rhythm, no murmurs rubs or gallops.  Abdomen: Bowel sounds are normal, mild right upper quadrant tenderness to deep palpation, nondistended, no hepatosplenomegaly or masses, no abdominal bruits or    hernia , no rebound or guarding.   Rectal: Not performed Extremities: No lower extremity edema. No clubbing or deformities.  Neuro: Alert and oriented x 4 , grossly normal neurologically.  Skin: Warm and dry, no rash or jaundice.   Psych: Alert and cooperative, normal mood and affect.

## 2016-12-08 NOTE — Assessment & Plan Note (Signed)
Discussed antireflux measures. Begin pantoprazole 40 mg daily.

## 2016-12-11 LAB — CBC WITH DIFFERENTIAL/PLATELET
Basophils Absolute: 0.1 10*3/uL (ref 0.0–0.2)
Basos: 1 %
EOS (ABSOLUTE): 0.3 10*3/uL (ref 0.0–0.4)
Eos: 6 %
Hematocrit: 39.9 % (ref 34.0–46.6)
Hemoglobin: 13.3 g/dL (ref 11.1–15.9)
Immature Grans (Abs): 0 10*3/uL (ref 0.0–0.1)
Immature Granulocytes: 0 %
Lymphocytes Absolute: 2.1 10*3/uL (ref 0.7–3.1)
Lymphs: 41 %
MCH: 29.1 pg (ref 26.6–33.0)
MCHC: 33.3 g/dL (ref 31.5–35.7)
MCV: 87 fL (ref 79–97)
Monocytes Absolute: 0.4 10*3/uL (ref 0.1–0.9)
Monocytes: 8 %
Neutrophils Absolute: 2.2 10*3/uL (ref 1.4–7.0)
Neutrophils: 44 %
Platelets: 359 10*3/uL (ref 150–379)
RBC: 4.57 x10E6/uL (ref 3.77–5.28)
RDW: 13.5 % (ref 12.3–15.4)
WBC: 5.2 10*3/uL (ref 3.4–10.8)

## 2016-12-11 LAB — C-REACTIVE PROTEIN: CRP: 4.8 mg/L (ref 0.0–4.9)

## 2016-12-11 LAB — COMPREHENSIVE METABOLIC PANEL
ALT: 27 IU/L (ref 0–32)
AST: 20 IU/L (ref 0–40)
Albumin/Globulin Ratio: 1.5 (ref 1.2–2.2)
Albumin: 4.4 g/dL (ref 3.5–5.5)
Alkaline Phosphatase: 81 IU/L (ref 39–117)
BUN/Creatinine Ratio: 15 (ref 9–23)
BUN: 10 mg/dL (ref 6–20)
Bilirubin Total: 0.3 mg/dL (ref 0.0–1.2)
CO2: 24 mmol/L (ref 20–29)
Calcium: 10 mg/dL (ref 8.7–10.2)
Chloride: 105 mmol/L (ref 96–106)
Creatinine, Ser: 0.67 mg/dL (ref 0.57–1.00)
GFR calc Af Amer: 144 mL/min/{1.73_m2} (ref >59)
GFR calc non Af Amer: 125 mL/min/{1.73_m2} (ref >59)
Globulin, Total: 3 g/dL (ref 1.5–4.5)
Glucose: 93 mg/dL (ref 65–99)
Potassium: 4.1 mmol/L (ref 3.5–5.2)
Sodium: 142 mmol/L (ref 134–144)
Total Protein: 7.4 g/dL (ref 6.0–8.5)

## 2016-12-11 LAB — IGA: IgA/Immunoglobulin A, Serum: 201 mg/dL (ref 87–352)

## 2016-12-11 LAB — SEDIMENTATION RATE: Sed Rate: 23 mm/hr (ref 0–32)

## 2016-12-11 LAB — TISSUE TRANSGLUTAMINASE, IGA: Transglutaminase IgA: <2 U/mL (ref 0–3)

## 2016-12-11 LAB — TSH: TSH: 1.5 u[IU]/mL (ref 0.450–4.500)

## 2016-12-11 NOTE — Progress Notes (Signed)
CC'ED TO PCP 

## 2016-12-12 ENCOUNTER — Telehealth: Payer: Self-pay | Admitting: Gastroenterology

## 2016-12-12 NOTE — Progress Notes (Signed)
Please let patient know that her labs are all normal. Thyroid, hemoglobin, liver and kidney labs normal. Celiac screen negative. Inflammatory markers normal.  I would like for her to give another week or so on pantoprazole and Restora to see if she has any notable improvement in her symptoms. At that time she should call and give a progress report and we will decide if she needs to have endoscopic evaluation.

## 2016-12-12 NOTE — Telephone Encounter (Signed)
See result note.  

## 2016-12-12 NOTE — Progress Notes (Signed)
LMOM to call.

## 2016-12-12 NOTE — Progress Notes (Signed)
Pt is aware.  

## 2016-12-12 NOTE — Telephone Encounter (Signed)
Pt had labs done recently and was checking if the results were available. She is aware that the results will be in her mychart and I told her the nurse would contact her after the labs were reviewed and signed off. [phone]

## 2016-12-12 NOTE — Telephone Encounter (Signed)
Forwarding to Leslie Lewis, PA for results.  

## 2016-12-21 ENCOUNTER — Telehealth: Payer: Self-pay | Admitting: Gastroenterology

## 2016-12-21 NOTE — Telephone Encounter (Signed)
[phone] PATIENT CALLED AND STATED THAT THE MEDICATION SHE WAS GIVEN AT HER LAST OV IS NOT HELPING HER WITH STOMACH PAIN, CAN SOMETHING ELSE BE TRIED.  PLEASE ADVISE.

## 2016-12-21 NOTE — Telephone Encounter (Signed)
PT said the Protonix has not helped at all. She is still having RLQ pain and sometimes in the upper quad also. It hits her at different times and no pattern. She said the Restora helped with her BM's, but the Protonix has not helped at all. Please advise!

## 2016-12-21 NOTE — Telephone Encounter (Signed)
LMOM to call.

## 2016-12-22 ENCOUNTER — Other Ambulatory Visit: Payer: Self-pay

## 2016-12-22 DIAGNOSIS — R1011 Right upper quadrant pain: Secondary | ICD-10-CM

## 2016-12-22 DIAGNOSIS — K625 Hemorrhage of anus and rectum: Secondary | ICD-10-CM

## 2016-12-22 DIAGNOSIS — K219 Gastro-esophageal reflux disease without esophagitis: Secondary | ICD-10-CM

## 2016-12-22 DIAGNOSIS — R197 Diarrhea, unspecified: Secondary | ICD-10-CM

## 2016-12-22 MED ORDER — PEG 3350-KCL-NA BICARB-NACL 420 G PO SOLR: 4000.0000 mL | ORAL | 0 refills | Status: DC

## 2016-12-22 NOTE — Telephone Encounter (Signed)
OK. As per OV plan. Let's proceed with TCS/EGD with [facility]. Dx: refractory gerd/ruq and rectal bleeding/intermittent diarrhea.   Augment conscious sedation with phenergan 25mg  iv 45 minutes before procedure.

## 2016-12-22 NOTE — Telephone Encounter (Signed)
Called and informed pt. TCS/EGD with [facility] scheduled for 12/26/16 at 1:00pm, pt to arrive at 12:00pm. Rx sent to pharmacy. Instructions faxed to [facility] for pt to pick-up when she gets prep (she is aware). Orders entered. Informed pt she will start clear liquids after lunch 12/24/16. PA info for TCS/EGD submitted online via [facility] website.

## 2016-12-25 NOTE — Telephone Encounter (Signed)
Noted  

## 2016-12-25 NOTE — Telephone Encounter (Signed)
[facility] at [facility] called and LMOV[facility]. PA not needed for TCS/EGD. Ref# [id number].

## 2016-12-26 ENCOUNTER — Ambulatory Visit (HOSPITAL_COMMUNITY)
Admission: RE | Admit: 2016-12-26 | Discharge: 2016-12-26 | Disposition: A | Discharge status: Home / Self Care | Payer: 59 | Source: Ambulatory Visit | Attending: Gastroenterology | Admitting: Gastroenterology

## 2016-12-26 ENCOUNTER — Encounter (HOSPITAL_COMMUNITY): Payer: Self-pay | Admitting: *Deleted

## 2016-12-26 ENCOUNTER — Encounter (HOSPITAL_COMMUNITY)
Admission: RE | Disposition: A | Discharge status: Home / Self Care | Payer: Self-pay | Source: Ambulatory Visit | Attending: Gastroenterology

## 2016-12-26 DIAGNOSIS — K625 Hemorrhage of anus and rectum: Secondary | ICD-10-CM | POA: Diagnosis not present

## 2016-12-26 DIAGNOSIS — K222 Esophageal obstruction: Secondary | ICD-10-CM | POA: Diagnosis not present

## 2016-12-26 DIAGNOSIS — R1011 Right upper quadrant pain: Secondary | ICD-10-CM | POA: Diagnosis not present

## 2016-12-26 DIAGNOSIS — K5281 Eosinophilic gastritis or gastroenteritis: Secondary | ICD-10-CM | POA: Insufficient documentation

## 2016-12-26 DIAGNOSIS — K644 Residual hemorrhoidal skin tags: Secondary | ICD-10-CM | POA: Diagnosis not present

## 2016-12-26 DIAGNOSIS — K580 Irritable bowel syndrome with diarrhea: Secondary | ICD-10-CM | POA: Diagnosis present

## 2016-12-26 DIAGNOSIS — K648 Other hemorrhoids: Secondary | ICD-10-CM | POA: Diagnosis not present

## 2016-12-26 DIAGNOSIS — K219 Gastro-esophageal reflux disease without esophagitis: Secondary | ICD-10-CM | POA: Diagnosis not present

## 2016-12-26 DIAGNOSIS — R197 Diarrhea, unspecified: Secondary | ICD-10-CM

## 2016-12-26 DIAGNOSIS — Z79899 Other long term (current) drug therapy: Secondary | ICD-10-CM | POA: Insufficient documentation

## 2016-12-26 HISTORY — PX: ESOPHAGOGASTRODUODENOSCOPY: SHX5428

## 2016-12-26 HISTORY — PX: BIOPSY: SHX5522

## 2016-12-26 HISTORY — PX: COLONOSCOPY: SHX5424

## 2016-12-26 SURGERY — COLONOSCOPY
Anesthesia: Moderate Sedation

## 2016-12-26 MED ORDER — MEPERIDINE HCL 100 MG/ML IJ SOLN
INTRAMUSCULAR | Status: DC | PRN
  Administered 2016-12-26: 25 mg via INTRAVENOUS
  Administered 2016-12-26: 50 mg via INTRAVENOUS
  Administered 2016-12-26: 25 mg via INTRAVENOUS

## 2016-12-26 MED ORDER — PROMETHAZINE HCL 25 MG/ML IJ SOLN
25.0000 mg | Freq: Once | INTRAMUSCULAR | Status: AC
  Administered 2016-12-26: 25 mg via INTRAVENOUS

## 2016-12-26 MED ORDER — SODIUM CHLORIDE 0.9 % IV SOLN
INTRAVENOUS | Status: DC
  Administered 2016-12-26: 12:00:00 via INTRAVENOUS

## 2016-12-26 MED ORDER — STERILE WATER FOR IRRIGATION IR SOLN
Status: DC | PRN
  Administered 2016-12-26: 13:00:00

## 2016-12-26 MED ORDER — MIDAZOLAM HCL 5 MG/5ML IJ SOLN
INTRAMUSCULAR | Status: AC
  Filled 2016-12-26: qty 10

## 2016-12-26 MED ORDER — MEPERIDINE HCL 100 MG/ML IJ SOLN
INTRAMUSCULAR | Status: AC
  Filled 2016-12-26: qty 2

## 2016-12-26 MED ORDER — SODIUM CHLORIDE 0.9% FLUSH
INTRAVENOUS | Status: AC
  Filled 2016-12-26: qty 10

## 2016-12-26 MED ORDER — LIDOCAINE VISCOUS 2 % MT SOLN
OROMUCOSAL | Status: AC
  Filled 2016-12-26: qty 15

## 2016-12-26 MED ORDER — MIDAZOLAM HCL 5 MG/5ML IJ SOLN
INTRAMUSCULAR | Status: DC | PRN
  Administered 2016-12-26 (×2): 2 mg via INTRAVENOUS
  Administered 2016-12-26: 1 mg via INTRAVENOUS

## 2016-12-26 MED ORDER — PROMETHAZINE HCL 25 MG/ML IJ SOLN
INTRAMUSCULAR | Status: AC
  Filled 2016-12-26: qty 1

## 2016-12-26 MED ORDER — LIDOCAINE VISCOUS 2 % MT SOLN
OROMUCOSAL | Status: DC | PRN
  Administered 2016-12-26: 4 mL via OROMUCOSAL

## 2016-12-26 NOTE — Op Note (Signed)
[facility] Patient Name: Jordan Jackson Procedure Date: 12/26/2016 12:33 PM MRN: [id number] Date of Birth: 1996-04-12 Attending MD: Jonette Eva , MD CSN: [id number] Age: 21 Admit Type: Outpatient Procedure:                Colonoscopy, DIAGNOSTIC Indications:              Abdominal pain in the right lower quadrant,                            Hematochezia, ALTERNATING Constipation(BS#2) AND                            Diarrhea Providers:                Jonette Eva, MD, Loma Messing B. Patsy Lager, RN, Dyann Ruddle Referring MD:             Geraldo Pitter MD Medicines:                Promethazine 25 mg IV, Meperidine 100 mg IV,                            Midazolam 4 mg IV Complications:            No immediate complications. Estimated Blood Loss:     Estimated blood loss: none. Procedure:                Pre-Anesthesia Assessment:                           - Prior to the procedure, a History and Physical                            was performed, and patient medications and                            allergies were reviewed. The patient's tolerance of                            previous anesthesia was also reviewed. The risks                            and benefits of the procedure and the sedation                            options and risks were discussed with the patient.                            All questions were answered, and informed consent                            was obtained. Prior Anticoagulants: The patient has  taken no previous anticoagulant or antiplatelet                            agents. ASA Grade Assessment: II - A patient with                            mild systemic disease. After reviewing the risks                            and benefits, the patient was deemed in                            satisfactory condition to undergo the procedure.                            After obtaining informed consent, the colonoscope                             was passed under direct vision. Throughout the                            procedure, the patient's blood pressure, pulse, and                            oxygen saturations were monitored continuously. The                            [id number]) was introduced through the anus                            and advanced to the 10 cm into the ileum. The                            colonoscopy was somewhat difficult due to a                            tortuous colon. Successful completion of the                            procedure was aided by COLOWRAP. The patient                            tolerated the procedure well. The quality of the                            bowel preparation was excellent. The terminal                            ileum, ileocecal valve, appendiceal orifice, and                            rectum were photographed. Scope In: 1:14:39 PM Scope Out: 1:26:48 PM Scope Withdrawal Time: 0 hours 10 minutes 17 seconds  Total Procedure  Duration: 0 hours 12 minutes 9 seconds  Findings:      The terminal ileum appeared normal.      The recto-sigmoid colon and sigmoid colon were moderately redundant.      The exam was otherwise normal throughout the examined colon.      Internal hemorrhoids were found during retroflexion. The hemorrhoids       were small.      External hemorrhoids were found during retroflexion. The hemorrhoids       were moderate. Impression:               - ALTERNATING BOWEL HABITS DUE TO IBS                           - Redundant LEFT colon.                           - RECTAL BLEEDING DUE TO Internal hemorrhoids.                           - External hemorrhoids. Moderate Sedation:      Moderate (conscious) sedation was administered by the endoscopy nurse       and supervised by the endoscopist. The following parameters were       monitored: oxygen saturation, heart rate, blood pressure, and response       to care. Total physician  intraservice time was 36 minutes. Recommendation:           - Repeat colonoscopy AGE 75 FOR SURVEILLANCE.                           - LOW FODMAP and low fat diet. CONTINUE WEIGHT LOSS                            EFFORTS.                           - Continue present medications.                           - Return to my office in 4 months.                           - Patient has a contact number available for                            emergencies. The signs and symptoms of potential                            delayed complications were discussed with the                            patient. Return to normal activities tomorrow.                            Written discharge instructions were provided to the  patient. Procedure Code(s):        --- Professional ---                           [phone], Colonoscopy, flexible; diagnostic, including                            collection of specimen(s) by brushing or washing,                            when performed (separate procedure)                           99152, Moderate sedation services provided by the                            same physician or other qualified health care                            professional performing the diagnostic or                            therapeutic service that the sedation supports,                            requiring the presence of an independent trained                            observer to assist in the monitoring of the                            patient's level of consciousness and physiological                            status; initial 15 minutes of intraservice time,                            patient age 16 years or older                           [phone], Moderate sedation services; each additional                            15 minutes intraservice time Diagnosis Code(s):        --- Professional ---                           K64.4, Residual hemorrhoidal skin tags                            K64.8, Other hemorrhoids                           R10.31, Right lower quadrant pain  K92.1, Melena (includes Hematochezia)                           K59.00, Constipation, unspecified                           R19.7, Diarrhea, unspecified                           Q43.8, Other specified congenital malformations of                            intestine CPT copyright 2016 [employer]. All rights reserved. The codes documented in this report are preliminary and upon coder review may  be revised to meet current compliance requirements. Jonette Eva, MD Jonette Eva, MD 12/26/2016 1:51:20 PM This report has been signed electronically. Number of Addenda: 0

## 2016-12-26 NOTE — Discharge Instructions (Signed)
YOUR UPPER AND LOWER ENDOSCOPY ARE NORMAL EXCEPT FOR INTERNAL HEMORRHOIDS. I BIOPSIED YOUR STOMACH & SMALL BOWEL.    DRINK WATER TO KEEP YOUR URINE LIGHT YELLOW.  FOLLOW A LOWFODMAP/LOW FAT DIET. AVOID ITEMS THAT CAUSE BLOATING. SEE INFO BELOW ON LOW FAT DIET.  CONTINUE YOUR WEIGHT LOSS EFFORTS.  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 THIS CAN SHORTEN YOUR LIFE EXPECTANCY 10 YEARS. AS WELL, [facility] ACTIVATES CANCER GENES. [facility] IS ASSOCIATED WITH AN INCREASE RISK FOR ALL CANCERS, INCLUDING ESOPHAGEAL AND COLON CANCER.  TRANSITION TO A PLANT BASED DIET-NO MEAT OR DAIRY for 6 MOS. AVOID ITEMS THAT CAUSE BLOATING & GAS. I RECOMMEND THE BOOK, "PREVENT AND REVERSE HEART DISEASE", CALDWELL ESSELSTYN JR., MD. PAGES 120-121 CLEARLY STATE HOW TO FOLLOW THE DIET AND THE LAST HALF OF THE BOOK HAS QUICK AND EASY RECIPES FOR BREAKFAST, LUNCH, AND DINNER ARE AFTER P 127.   CONTINUE PROTONIX. TAKE 30 MINUTES PRIOR TO BREAKFAST.  YOUR BIOPSY RESULTS WILL BE AVAILABLE IN MY CHART AFTER JUL 7 AND MY OFFICE WILL CONTACT YOU IN 10-14 DAYS WITH YOUR RESULTS.   FOLLOW UP IN 4 MOS.    ENDOSCOPY Care After Read the instructions outlined below and refer to this sheet in the next week. These discharge instructions provide you with general information on caring for yourself after you leave the hospital. While your treatment has been planned according to the most current medical practices available, unavoidable complications occasionally occur. If you have any problems or questions after discharge, call DR. Jama Mcmiller, [phone].  ACTIVITY  You may resume your regular activity, but move at a slower pace for the next 24 hours.   Take frequent rest periods for the next 24 hours.   Walking will help get rid of the air and reduce the bloated feeling in your belly (abdomen).   No driving for 24 hours (because of the medicine (anesthesia) used during the  test).   You may shower.   Do not sign any important legal documents or operate any machinery for 24 hours (because of the anesthesia used during the test).    NUTRITION  Drink plenty of fluids.   You may resume your normal diet as instructed by your doctor.   Begin with a light meal and progress to your normal diet. Heavy or fried foods are harder to digest and may make you feel sick to your stomach (nauseated).   Avoid alcoholic beverages for 24 hours or as instructed.    MEDICATIONS  You may resume your normal medications.   WHAT YOU CAN EXPECT TODAY  Some feelings of bloating in the abdomen.   Passage of more gas than usual.   Spotting of blood in your stool or on the toilet paper  .  IF YOU HAD POLYPS REMOVED DURING THE ENDOSCOPY:  Eat a soft diet IF YOU HAVE NAUSEA, BLOATING, ABDOMINAL PAIN, OR VOMITING.    FINDING OUT THE RESULTS OF YOUR TEST Not all test results are available during your visit. DR. Darrick Penna WILL CALL YOU WITHIN 14 DAYS OF YOUR PROCEDUE WITH YOUR RESULTS. Do not assume everything is normal if you have not heard from DR. Quintina Hakeem, CALL HER OFFICE AT [phone].  SEEK IMMEDIATE MEDICAL ATTENTION AND CALL THE OFFICE: [phone] IF:  You have more than a spotting of blood in your stool.   Your belly is swollen (abdominal distention).   You are nauseated or vomiting.  You have a temperature over 101F.   You have abdominal pain or discomfort that is severe or gets worse throughout the day.    High-Fiber Diet A high-fiber diet changes your normal diet to include more whole grains, legumes, fruits, and vegetables. Changes in the diet involve replacing refined carbohydrates with unrefined foods. The calorie level of the diet is essentially unchanged. The Dietary Reference Intake (recommended amount) for adult males is 38 grams per day. For adult females, it is 25 grams per day. Pregnant and lactating women should consume 28 grams of fiber per  day. Fiber is the intact part of a plant that is not broken down during digestion. Functional fiber is fiber that has been isolated from the plant to provide a beneficial effect in the body. PURPOSE  Increase stool bulk.   Ease and regulate bowel movements.   Lower cholesterol.   REDUCE RISK OF COLON CANCER  INDICATIONS THAT YOU NEED MORE FIBER  Constipation and hemorrhoids.   Uncomplicated diverticulosis (intestine condition) and irritable bowel syndrome.   Weight management.   As a protective measure against hardening of the arteries (atherosclerosis), diabetes, and cancer.   GUIDELINES FOR INCREASING FIBER IN THE DIET  Start adding fiber to the diet slowly. A gradual increase of about 5 more grams (2 slices of whole-wheat bread, 2 servings of most fruits or vegetables, or 1 bowl of high-fiber cereal) per day is best. Too rapid an increase in fiber may result in constipation, flatulence, and bloating.   Drink enough water and fluids to keep your urine clear or pale yellow. Water, juice, or caffeine-free drinks are recommended. Not drinking enough fluid may cause constipation.   Eat a variety of high-fiber foods rather than one type of fiber.   Try to increase your intake of fiber through using high-fiber foods rather than fiber pills or supplements that contain small amounts of fiber.   The goal is to change the types of food eaten. Do not supplement your present diet with high-fiber foods, but replace foods in your present diet.   INCLUDE A VARIETY OF FIBER SOURCES  Replace refined and processed grains with whole grains, canned fruits with fresh fruits, and incorporate other fiber sources. White rice, white breads, and most bakery goods contain little or no fiber.   Brown whole-grain rice, buckwheat oats, and many fruits and vegetables are all good sources of fiber. These include: broccoli, Brussels sprouts, cabbage, cauliflower, beets, sweet potatoes, white potatoes (skin  on), carrots, tomatoes, eggplant, squash, berries, fresh fruits, and dried fruits.   Cereals appear to be the richest source of fiber. Cereal fiber is found in whole grains and bran. Bran is the fiber-rich outer coat of cereal grain, which is largely removed in refining. In whole-grain cereals, the bran remains. In breakfast cereals, the largest amount of fiber is found in those with "bran" in their names. The fiber content is sometimes indicated on the label.   You may need to include additional fruits and vegetables each day.   In baking, for 1 cup white flour, you may use the following substitutions:   1 cup whole-wheat flour minus 2 tablespoons.   1/2 cup white flour plus 1/2 cup whole-wheat flour.   Low-Fat Diet BREADS, CEREALS, PASTA, RICE, DRIED PEAS, AND BEANS These products are high in carbohydrates and most are low in fat. Therefore, they can be increased in the diet as substitutes for fatty foods. They too, however, contain calories and should not be eaten in excess.  Cereals can be eaten for snacks as well as for breakfast.  Include foods that contain fiber (fruits, vegetables, whole grains, and legumes). Research shows that fiber may lower blood cholesterol levels, especially the water-soluble fiber found in fruits, vegetables, oat products, and legumes. FRUITS AND VEGETABLES It is good to eat fruits and vegetables. Besides being sources of fiber, both are rich in vitamins and some minerals. They help you get the daily allowances of these nutrients. Fruits and vegetables can be used for snacks and desserts. MEATS Limit lean meat, chicken, [place], and fish to no more than 6 ounces per day. Beef, Pork, and Lamb Use lean cuts of beef, pork, and lamb. Lean cuts include:  Extra-lean ground beef.  Arm roast.  Sirloin tip.  Center-cut ham.  Round steak.  Loin chops.  Rump roast.  Tenderloin.  Trim all fat off the outside of meats before cooking. It is not necessary to severely  decrease the intake of red meat, but lean choices should be made. Lean meat is rich in protein and contains a highly absorbable form of iron. Premenopausal women, in particular, should avoid reducing lean red meat because this could increase the risk for low red blood cells (iron-deficiency anemia). The organ meats, such as liver, sweetbreads, kidneys, and brain are very rich in cholesterol. They should be limited. Chicken and [place] These are good sources of protein. The fat of poultry can be reduced by removing the skin and underlying fat layers before cooking. Chicken and [place] can be substituted for lean red meat in the diet. Poultry should not be fried or covered with high-fat sauces. Fish and Shellfish Fish is a good source of protein. Shellfish contain cholesterol, but they usually are low in saturated fatty acids. The preparation of fish is important. Like chicken and [place], they should not be fried or covered with high-fat sauces. EGGS Egg whites contain no fat or cholesterol. They can be eaten often. Try 1 to 2 egg whites instead of whole eggs in recipes or use egg substitutes that do not contain yolk. MILK AND DAIRY PRODUCTS Use skim or 1% milk instead of 2% or whole milk. Decrease whole milk, natural, and processed cheeses. Use nonfat or low-fat (2%) cottage cheese or low-fat cheeses made from vegetable oils. Choose nonfat or low-fat (1 to 2%) yogurt. Experiment with evaporated skim milk in recipes that call for heavy cream. Substitute low-fat yogurt or low-fat cottage cheese for sour cream in dips and salad dressings. Have at least 2 servings of low-fat dairy products, such as 2 glasses of skim (or 1%) milk each day to help get your daily calcium intake.  FATS AND OILS Reduce the total intake of fats, especially saturated fat. Butterfat, lard, and beef fats are high in saturated fat and cholesterol. These should be avoided as much as possible. Vegetable fats do not contain cholesterol, but  certain vegetable fats, such as coconut oil, palm oil, and palm kernel oil are very high in saturated fats. These should be limited. These fats are often used in bakery goods, processed foods, popcorn, oils, and nondairy creamers. Vegetable shortenings and some peanut butters contain hydrogenated oils, which are also saturated fats. Read the labels on these foods and check for saturated vegetable oils. Unsaturated vegetable oils and fats do not raise blood cholesterol. However, they should be limited because they are fats and are high in calories. Total fat should still be limited to 30% of your daily caloric intake. Desirable liquid vegetable oils are corn  oil, cottonseed oil, olive oil, canola oil, safflower oil, soybean oil, and sunflower oil. Peanut oil is not as good, but small amounts are acceptable. Buy a heart-healthy tub margarine that has no partially hydrogenated oils in the ingredients. Mayonnaise and salad dressings often are made from unsaturated fats, but they should also be limited because of their high calorie and fat content. Seeds, nuts, peanut butter, olives, and avocados are high in fat, but the fat is mainly the unsaturated type. These foods should be limited mainly to avoid excess calories and fat. OTHER EATING TIPS Snacks  Most sweets should be limited as snacks. They tend to be rich in calories and fats, and their caloric content outweighs their nutritional value. Some good choices in snacks are graham crackers, melba toast, soda crackers, bagels (no egg), English muffins, fruits, and vegetables. These snacks are preferable to snack crackers, [place] fries, and chips. Popcorn should be air-popped or cooked in small amounts of liquid vegetable oil. Desserts Eat fruit, low-fat yogurt, and fruit ices. AVOID pastries, cake, and cookies. Sherbet, angel food cake, gelatin dessert, frozen low-fat yogurt, or other frozen products that do not contain saturated fat (pure fruit juice bars, frozen  ice pops) are also acceptable.  COOKING METHODS Choose those methods that use little or no fat. They include: Poaching.  Braising.  Steaming.  Grilling.  Baking.  Stir-frying.  Broiling.  Microwaving.  Foods can be cooked in a nonstick pan without added fat, or use a nonfat cooking spray in regular cookware. Limit fried foods and avoid frying in saturated fat. Add moisture to lean meats by using water, broth, cooking wines, and other nonfat or low-fat sauces along with the cooking methods mentioned above. Soups and stews should be chilled after cooking. The fat that forms on top after a few hours in the refrigerator should be skimmed off. When preparing meals, avoid using excess salt. Salt can contribute to raising blood pressure in some people. EATING AWAY FROM HOME Order entres, potatoes, and vegetables without sauces or butter. When meat exceeds the size of a deck of cards (3 to 4 ounces), the rest can be taken home for another meal. Choose vegetable or fruit salads and ask for low-calorie salad dressings to be served on the side. Use dressings sparingly. Limit high-fat toppings, such as bacon, crumbled eggs, cheese, sunflower seeds, and olives. Ask for heart-healthy tub margarine instead of butter.

## 2016-12-26 NOTE — H&P (Signed)
  Primary Care Physician:  Renaye RakersBland, Veita, MD Primary Gastroenterologist:  Dr. Darrick PennaFields  Pre-Procedure History & Physical: HPI:  [name] is a 21 y.o. female here for RUQ/RLQ ABDOMINAL  PAIN/alternating bowelhabits.  Past Medical History:  Diagnosis Date  . Contraceptive management 11/15/2015  . Fatty liver 10/24/2016   Refer to GI  . Irregular periods/menstrual cycles 08/10/2015  . Menstrual cramps 08/10/2015  . Pelvic pain in female     Past Surgical History:  Procedure Laterality Date  . NO PAST SURGERIES      Prior to Admission medications   Medication Sig Start Date End Date Taking? Authorizing Provider  LO LOESTRIN FE 1 MG-10 MCG / 10 MCG tablet TAKE 1 TABLET EVERY DAY 12/08/16  Yes Cyril MourningGriffin, Jennifer A, NP  pantoprazole (PROTONIX) 40 MG tablet Take 1 tablet (40 mg total) by mouth daily before breakfast. 12/08/16  Yes Tiffany KocherLewis, Leslie S, PA-C  triamcinolone cream (KENALOG) 0.1 % Apply 1 application topically 2 (two) times daily as needed (eczema).    [provider]    Allergies as of 12/22/2016  . (No Known Allergies)    Family History  Problem Relation Age of Onset  . Hypertension Father   . Diabetes Maternal Grandmother   . Hypertension Paternal Grandmother   . Hypertension Paternal Grandfather   . Diabetes Paternal Grandfather   . Inflammatory bowel disease Neg Hx   . Liver disease Neg Hx   . Celiac disease Neg Hx   . Colon cancer Neg Hx     Social History   Social History  . Marital status: Single    Spouse name: N/A  . Number of children: N/A  . Years of education: N/A   Occupational History  . nurse tech    Social History Main Topics  . Smoking status: Never Smoker  . Smokeless tobacco: Never Used  . Alcohol use No  . Drug use: No  . Sexual activity: Yes    Birth control/ protection: Pill   Other Topics Concern  . Not on file   Social History Narrative  . No narrative on file    Review of Systems: See HPI, otherwise negative  ROS   Physical Exam: BP 124/75   Pulse 92   Temp 99 F (37.2 C) (Oral)   Resp (!) 9   Ht 5\' 3"  (1.6 m)   Wt 234 lb (106.1 kg)   LMP 12/12/2016   SpO2 100%   BMI 41.45 kg/m  General:   Alert,  pleasant and cooperative in NAD Head:  Normocephalic and atraumatic. Neck:  Supple; Lungs:  Clear throughout to auscultation.    Heart:  Regular rate and rhythm. Abdomen:  Soft, nontender and nondistended. Normal bowel sounds, without guarding, and without rebound.   Neurologic:  Alert and  oriented x4;  grossly normal neurologically.  Impression/Plan:     RUQ/RLQ ABDOMINAL  PAIN/alternating bowel habits  PLAN: 1. EGD/TCS TODAY. DISCUSSED PROCEDURE, BENEFITS, & RISKS: < 1% chance of medication reaction, bleeding, perforation, or rupture of spleen/liver.

## 2016-12-26 NOTE — Op Note (Signed)
[facility] Patient Name: Jordan Jackson Procedure Date: 12/26/2016 1:27 PM MRN: [id number] Date of Birth: 1995-07-07 Attending MD: Jonette Eva , MD CSN: [id number] Age: 21 Admit Type: Outpatient Procedure:                Upper GI endoscopy WITH COLD FORCEPS BIOPSY Indications:              Abdominal pain in the right upper quadrant,                            Diarrhea ALTERNATING WITH CONSTIPATION, BMI > 40. Providers:                Jonette Eva, MD, Criselda Peaches. Patsy Lager, RN, Dyann Ruddle Referring MD:              Medicines:                TCS + Midazolam 1 mg IV Complications:            No immediate complications. Estimated Blood Loss:     Estimated blood loss was minimal. Procedure:                Pre-Anesthesia Assessment:                           - Prior to the procedure, a History and Physical                            was performed, and patient medications and                            allergies were reviewed. The patient's tolerance of                            previous anesthesia was also reviewed. The risks                            and benefits of the procedure and the sedation                            options and risks were discussed with the patient.                            All questions were answered, and informed consent                            was obtained. Prior Anticoagulants: The patient has                            taken no previous anticoagulant or antiplatelet                            agents. ASA Grade Assessment: II - A patient with  mild systemic disease. After reviewing the risks                            and benefits, the patient was deemed in                            satisfactory condition to undergo the procedure.                            After obtaining informed consent, the endoscope was                            passed under direct vision. Throughout the        procedure, the patient's blood pressure, pulse, and                            oxygen saturations were monitored continuously. The                            [id number] ([id number]) scope was introduced through the                            mouth, and advanced to the second part of duodenum.                            The upper GI endoscopy was accomplished without                            difficulty. The patient tolerated the procedure                            well. Scope In: 1:32:26 PM Scope Out: 1:39:26 PM Total Procedure Duration: 0 hours 7 minutes 0 seconds  Findings:      The entire examined stomach was normal. Biopsies were taken with a cold       forceps for Helicobacter pylori testing.      The examined duodenum was normal. Biopsies for histology were taken with       a cold forceps for evaluation of celiac disease.      A non-obstructing Schatzki ring (acquired) was found at the       gastroesophageal junction. Impression:               - RUQ PAIN DUE TO IBS                           - INCOMPLETE SCHATZKI'S RING                           - NO SOURCE FOR DIARRHEA INDENTIFIED AND MOST                            LIKELY DUE TO IBS Moderate Sedation:      Moderate (conscious) sedation was administered by the endoscopy nurse       and supervised by the endoscopist. The following parameters were  monitored: oxygen saturation, heart rate, blood pressure, and response       to care. Total physician intraservice time was 36 minutes. Recommendation:           - Await pathology results. LOSE WEIGHT.                           - Low fat diet and FODMAP DIET.                           - Continue present medications.                           - Return to my office in 4 months.                           - Patient has a contact number available for                            emergencies. The signs and symptoms of potential                            delayed complications were  discussed with the                            patient. Return to normal activities tomorrow.                            Written discharge instructions were provided to the                            patient. Procedure Code(s):        --- Professional ---                           [phone], Esophagogastroduodenoscopy, flexible,                            transoral; with biopsy, single or multiple                           99152, Moderate sedation services provided by the                            same physician or other qualified health care                            professional performing the diagnostic or                            therapeutic service that the sedation supports,                            requiring the presence of an independent trained  observer to assist in the monitoring of the                            patient's level of consciousness and physiological                            status; initial 15 minutes of intraservice time,                            patient age 7 years or older                           [phone], Moderate sedation services; each additional                            15 minutes intraservice time Diagnosis Code(s):        --- Professional ---                           R10.11, Right upper quadrant pain                           R19.7, Diarrhea, unspecified CPT copyright 2016 American Medical Association. All rights reserved. The codes documented in this report are preliminary and upon coder review may  be revised to meet current compliance requirements. Jonette Eva, MD Jonette Eva, MD 12/26/2016 1:56:09 PM This report has been signed electronically. Number of Addenda: 0

## 2016-12-31 ENCOUNTER — Telehealth: Payer: Self-pay | Admitting: Gastroenterology

## 2017-01-02 ENCOUNTER — Encounter (HOSPITAL_COMMUNITY): Payer: Self-pay | Admitting: Gastroenterology

## 2017-01-04 NOTE — Progress Notes (Signed)
Discussed with Dr. Darrick PennaFields. 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   Please send in RX for Prednisone 10mg  tablets, #40, no refills. Take 20mg  (2 tablets) po daily for 14 days, 10mg  (1 tablet) po daily for 7 days, 5mg  (1/2 tablet) po daily for 10 days, then stop.

## 2017-01-04 NOTE — Progress Notes (Signed)
Forwarding to Tana CoastLeslie Lewis, PA who saw the pt in the office.

## 2017-01-04 NOTE — Progress Notes (Signed)
[facility] aware [facility]

## 2017-01-09 NOTE — Telephone Encounter (Signed)
SEE PRIOR TC. 

## 2017-01-19 ENCOUNTER — Telehealth: Payer: Self-pay | Admitting: Adult Health

## 2017-01-19 NOTE — Telephone Encounter (Signed)
Spoke with pt. Pt was put on birth control pills for ovarian cysts. She states she has been diagnosed with gastritis. She started her cycle Sunday and it's heavy. Pt was wondering if she should continue taking the pill. I spoke with JAG and she advised to continue taking pill. Call got disconnected and I called pt back and left message. JSY

## 2017-01-30 ENCOUNTER — Ambulatory Visit (HOSPITAL_COMMUNITY)
Admission: EM | Admit: 2017-01-30 | Discharge: 2017-01-30 | Disposition: A | Discharge status: Home / Self Care | Payer: 59 | Attending: Family Medicine | Admitting: Family Medicine

## 2017-01-30 ENCOUNTER — Encounter (HOSPITAL_COMMUNITY): Payer: Self-pay | Admitting: Emergency Medicine

## 2017-01-30 DIAGNOSIS — N12 Tubulo-interstitial nephritis, not specified as acute or chronic: Secondary | ICD-10-CM | POA: Diagnosis not present

## 2017-01-30 DIAGNOSIS — R102 Pelvic and perineal pain: Secondary | ICD-10-CM

## 2017-01-30 DIAGNOSIS — Z3202 Encounter for pregnancy test, result negative: Secondary | ICD-10-CM | POA: Diagnosis not present

## 2017-01-30 DIAGNOSIS — N946 Dysmenorrhea, unspecified: Secondary | ICD-10-CM | POA: Diagnosis present

## 2017-01-30 DIAGNOSIS — M549 Dorsalgia, unspecified: Secondary | ICD-10-CM | POA: Diagnosis present

## 2017-01-30 LAB — POCT URINALYSIS DIP (DEVICE)
Bilirubin Urine: NEGATIVE
Glucose, UA: NEGATIVE mg/dL
Ketones, ur: NEGATIVE mg/dL
Nitrite: POSITIVE — AB
Protein, ur: 30 mg/dL — AB
Specific Gravity, Urine: 1.015 (ref 1.005–1.030)
Urobilinogen, UA: 0.2 mg/dL (ref 0.0–1.0)
pH: 7 (ref 5.0–8.0)

## 2017-01-30 LAB — POCT PREGNANCY, URINE: Preg Test, Ur: NEGATIVE

## 2017-01-30 MED ORDER — CIPROFLOXACIN HCL 500 MG PO TABS: 500.0000 mg | ORAL_TABLET | Freq: Two times a day (BID) | ORAL | 0 refills | Status: AC

## 2017-01-30 MED ORDER — FLUCONAZOLE 150 MG PO TABS: 150.0000 mg | ORAL_TABLET | Freq: Every day | ORAL | 0 refills | Status: DC

## 2017-01-30 NOTE — ED Provider Notes (Signed)
[facility]    CSN: [id number] Arrival date & time: 01/30/17  1122     History   Chief Complaint Chief Complaint  Patient presents with  . Back Pain    HPI Jordan Jackson is a 21 y.o. female.   21 year old female with history of irregular cycles, menstrual cramps, fatty liver comes in for four-day history of left mid back pain. She states it started out mild, and that she might of strained a muscle, although no specific activity that she can think of. Pain has worsened since then, and travels down to her pelvic/left leg area. She noticed some odor and cloudiness to her urine, but denies frequency, dysuria, hematuria. She is sexually active, with one partner, no condom use. Denies vaginal symptoms such as discharge, itching/pain. She does have some vaginal spotting, and states she had missed 1 day of her birth control due to the pain she is having. Denies history of kidney stones. She has some low pelvic pain, with nausea, denies vomiting, diarrhea, constipation. Denies numbness and tingling of leg. Denies recent increase in activity, heavy lifting.      Past Medical History:  Diagnosis Date  . Contraceptive management 11/15/2015  . Fatty liver 10/24/2016   Refer to GI  . Irregular periods/menstrual cycles 08/10/2015  . Menstrual cramps 08/10/2015  . Pelvic pain in female     Patient Active Problem List   Diagnosis Date Noted  . RUQ pain 12/08/2016  . Alternating constipation and diarrhea 12/08/2016  . GERD (gastroesophageal reflux disease) 12/08/2016  . Rectal bleeding 12/08/2016  . Fatty liver 10/24/2016  . Contraceptive management 11/15/2015  . Irregular periods/menstrual cycles 08/10/2015  . Menstrual cramps 08/10/2015    Past Surgical History:  Procedure Laterality Date  . BIOPSY  12/26/2016   Procedure: BIOPSY;  Surgeon: West BaliFields, Sandi L, MD;  Location: [facility];  Service: Endoscopy;;  gastric duodenal  . COLONOSCOPY N/A 12/26/2016   Procedure:  COLONOSCOPY;  Surgeon: West BaliFields, Sandi L, MD;  Location: [facility];  Service: Endoscopy;  Laterality: N/A;  1:00pm  . ESOPHAGOGASTRODUODENOSCOPY N/A 12/26/2016   Procedure: ESOPHAGOGASTRODUODENOSCOPY (EGD);  Surgeon: West BaliFields, Sandi L, MD;  Location: [facility];  Service: Endoscopy;  Laterality: N/A;  . NO PAST SURGERIES      OB History    Gravida Para Term Preterm AB Living   0 0 0 0 0 0   SAB TAB Ectopic Multiple Live Births   0 0 0 0         Home Medications    Prior to Admission medications   Medication Sig Start Date End Date Taking? Authorizing Provider  LO LOESTRIN FE 1 MG-10 MCG / 10 MCG tablet TAKE 1 TABLET EVERY DAY 12/08/16  Yes Cyril MourningGriffin, Jennifer A, NP  predniSONE (DELTASONE) 10 MG tablet Take 10 mg by mouth daily with breakfast.   Yes [provider]  ciprofloxacin (CIPRO) 500 MG tablet Take 1 tablet (500 mg total) by mouth 2 (two) times daily. 01/30/17 02/06/17  Belinda FisherYu, Edahi Kroening V, PA-C  fluconazole (DIFLUCAN) 150 MG tablet Take 1 tablet (150 mg total) by mouth daily. Take second dose 72 hours later if symptoms still persists. 01/30/17   Belinda FisherYu, Jem Castro V, PA-C    Family History Family History  Problem Relation Age of Onset  . Hypertension Father   . Diabetes Maternal Grandmother   . Hypertension Paternal Grandmother   . Hypertension Paternal Grandfather   . Diabetes Paternal Grandfather   . Inflammatory bowel  disease Neg Hx   . Liver disease Neg Hx   . Celiac disease Neg Hx   . Colon cancer Neg Hx     Social History Social History  Substance Use Topics  . Smoking status: Never Smoker  . Smokeless tobacco: Never Used  . Alcohol use No     Allergies   Patient has no known allergies.   Review of Systems Review of Systems  Reason unable to perform ROS: See HPI as above.     Physical Exam Triage Vital Signs ED Triage Vitals  Enc Vitals Group     BP 01/30/17 1144 125/84     Pulse Rate 01/30/17 1144 (!) 134     Resp 01/30/17 1144 18     Temp 01/30/17 1144  99.9 F (37.7 C)     Temp Source 01/30/17 1144 Oral     SpO2 01/30/17 1144 97 %     Weight 01/30/17 1144 230 lb (104.3 kg)     Height 01/30/17 1144 5\' 3"  (1.6 m)     Head Circumference --      Peak Flow --      Pain Score 01/30/17 1201 7     Pain Loc --      Pain Edu? --      Excl. in GC? --    No data found.   Updated Vital Signs BP 125/84 (BP Location: Right Arm)   Pulse (!) 134   Temp 99.9 F (37.7 C) (Oral)   Resp 18   Ht 5\' 3"  (1.6 m)   Wt 230 lb (104.3 kg)   SpO2 97%   BMI 40.74 kg/m   Visual Acuity Right Eye Distance:   Left Eye Distance:   Bilateral Distance:    Right Eye Near:   Left Eye Near:    Bilateral Near:     Physical Exam  Constitutional: She is oriented to person, place, and time. She appears well-developed and well-nourished. No distress.  HENT:  Head: Normocephalic and atraumatic.  Eyes: Pupils are equal, round, and reactive to light. Conjunctivae are normal.  Cardiovascular: Normal rate, regular rhythm and normal heart sounds.  Exam reveals no gallop and no friction rub.   No murmur heard. Pulmonary/Chest: Effort normal and breath sounds normal. She has no wheezes. She has no rales.  Abdominal: Soft. Bowel sounds are normal. She exhibits no mass. There is tenderness (Tenderness on LLQ). There is no rebound, no guarding and no CVA tenderness.  Neurological: She is alert and oriented to person, place, and time.  Skin: Skin is warm and dry.  Psychiatric: She has a normal mood and affect. Her behavior is normal. Judgment normal.     UC Treatments / Results  Labs (all labs ordered are listed, but only abnormal results are displayed) Labs Reviewed  POCT URINALYSIS DIP (DEVICE) - Abnormal; Notable for the following:       Result Value   Hgb urine dipstick TRACE (*)    Protein, ur 30 (*)    Nitrite POSITIVE (*)    Leukocytes, UA MODERATE (*)    All other components within normal limits  URINE CULTURE  POCT PREGNANCY, URINE    EKG  EKG  Interpretation None       Radiology No results found.  Procedures Procedures (including critical care time)  Medications Ordered in UC Medications - No data to display   Initial Impression / Assessment and Plan / UC Course  I have reviewed the triage vital signs and  the nursing notes.  Pertinent labs & imaging results that were available during my care of the patient were reviewed by me and considered in my medical decision making (see chart for details).   Urine dipstick positive for nitrites and leukocytes, although without CVA tenderness, patient is experiencing moderate flank pain. Will treat for pyelonephritis. Patient without fever, and no acute distress, no indications of inpatient therapy. Start Cipro as directed. Given history of recent infection, Diflucan called in. Patient to push fluids. Return precautions given.   Final Clinical Impressions(s) / UC Diagnoses   Final diagnoses:  Pyelonephritis    New Prescriptions New Prescriptions   CIPROFLOXACIN (CIPRO) 500 MG TABLET    Take 1 tablet (500 mg total) by mouth 2 (two) times daily.   FLUCONAZOLE (DIFLUCAN) 150 MG TABLET    Take 1 tablet (150 mg total) by mouth daily. Take second dose 72 hours later if symptoms still persists.     Controlled Substance Prescriptions [place] Controlled Substance Registry consulted? Not Applicable   Belinda Fisher, PA-C 01/30/17 1330

## 2017-01-30 NOTE — ED Triage Notes (Signed)
[facility] a [facility] of left lower back pain that radiates down her left leg. The patient denied any known injury.

## 2017-01-30 NOTE — Discharge Instructions (Signed)
Your urine was positive for bacteria, given back pain, treating you for kidney infection. Start Cipro 500 mg twice a day for 7 days. Given your history of yeast infection on antibiotics, Diflucan is called in. Keep hydrated, your urine should be clear /pale yellow in color. Monitor for worsening of symptoms, abdominal pain, nausea, vomiting, fever, follow-up for reevaluation.

## 2017-01-31 ENCOUNTER — Emergency Department (HOSPITAL_COMMUNITY)
Admission: EM | Admit: 2017-01-31 | Discharge: 2017-01-31 | Disposition: A | Discharge status: Home / Self Care | Payer: 59 | Attending: Emergency Medicine | Admitting: Emergency Medicine

## 2017-01-31 ENCOUNTER — Encounter (HOSPITAL_COMMUNITY): Payer: Self-pay | Admitting: Cardiology

## 2017-01-31 ENCOUNTER — Emergency Department (HOSPITAL_COMMUNITY): Payer: 59

## 2017-01-31 DIAGNOSIS — N12 Tubulo-interstitial nephritis, not specified as acute or chronic: Secondary | ICD-10-CM | POA: Insufficient documentation

## 2017-01-31 DIAGNOSIS — Z79899 Other long term (current) drug therapy: Secondary | ICD-10-CM | POA: Diagnosis not present

## 2017-01-31 DIAGNOSIS — R509 Fever, unspecified: Secondary | ICD-10-CM | POA: Diagnosis present

## 2017-01-31 DIAGNOSIS — N10 Acute tubulo-interstitial nephritis: Secondary | ICD-10-CM | POA: Diagnosis not present

## 2017-01-31 HISTORY — DX: Gastritis, unspecified, without bleeding: K29.70

## 2017-01-31 LAB — COMPREHENSIVE METABOLIC PANEL
ALT: 28 U/L (ref 14–54)
AST: 21 U/L (ref 15–41)
Albumin: 3.8 g/dL (ref 3.5–5.0)
Alkaline Phosphatase: 77 U/L (ref 38–126)
Anion gap: 11 (ref 5–15)
BUN: 7 mg/dL (ref 6–20)
CO2: 20 mmol/L — ABNORMAL LOW (ref 22–32)
Calcium: 9.4 mg/dL (ref 8.9–10.3)
Chloride: 106 mmol/L (ref 101–111)
Creatinine, Ser: 0.85 mg/dL (ref 0.44–1.00)
GFR calc Af Amer: >60 mL/min (ref >60)
GFR calc non Af Amer: >60 mL/min (ref >60)
Glucose, Bld: 94 mg/dL (ref 65–99)
Potassium: 3.1 mmol/L — ABNORMAL LOW (ref 3.5–5.1)
Sodium: 137 mmol/L (ref 135–145)
Total Bilirubin: 1.1 mg/dL (ref 0.3–1.2)
Total Protein: 8.6 g/dL — ABNORMAL HIGH (ref 6.5–8.1)

## 2017-01-31 LAB — CBC WITH DIFFERENTIAL/PLATELET
Basophils Absolute: 0 10*3/uL (ref 0.0–0.1)
Basophils Relative: 0 %
Eosinophils Absolute: 0 10*3/uL (ref 0.0–0.7)
Eosinophils Relative: 0 %
HCT: 37.6 % (ref 36.0–46.0)
Hemoglobin: 13 g/dL (ref 12.0–15.0)
Lymphocytes Relative: 11 %
Lymphs Abs: 1.8 10*3/uL (ref 0.7–4.0)
MCH: 29.8 pg (ref 26.0–34.0)
MCHC: 34.6 g/dL (ref 30.0–36.0)
MCV: 86.2 fL (ref 78.0–100.0)
Monocytes Absolute: 2.3 10*3/uL — ABNORMAL HIGH (ref 0.1–1.0)
Monocytes Relative: 13 %
Neutro Abs: 13.2 10*3/uL — ABNORMAL HIGH (ref 1.7–7.7)
Neutrophils Relative %: 76 %
Platelets: 338 10*3/uL (ref 150–400)
RBC: 4.36 MIL/uL (ref 3.87–5.11)
RDW: 13.5 % (ref 11.5–15.5)
WBC: 17.3 10*3/uL — ABNORMAL HIGH (ref 4.0–10.5)

## 2017-01-31 LAB — URINALYSIS, ROUTINE W REFLEX MICROSCOPIC
Bilirubin Urine: NEGATIVE
Glucose, UA: NEGATIVE mg/dL
Ketones, ur: 20 mg/dL — AB
Leukocytes, UA: NEGATIVE
Nitrite: NEGATIVE
Protein, ur: NEGATIVE mg/dL
Specific Gravity, Urine: 1.01 (ref 1.005–1.030)
pH: 8 (ref 5.0–8.0)

## 2017-01-31 LAB — I-STAT CG4 LACTIC ACID, ED: Lactic Acid, Venous: 2.52 mmol/L (ref 0.5–1.9)

## 2017-01-31 LAB — I-STAT BETA HCG BLOOD, ED (MC, WL, AP ONLY): I-stat hCG, quantitative: 11.9 m[IU]/mL — ABNORMAL HIGH (ref <5)

## 2017-01-31 MED ORDER — ACETAMINOPHEN 325 MG PO TABS
650.0000 mg | ORAL_TABLET | Freq: Once | ORAL | Status: AC
  Administered 2017-01-31: 650 mg via ORAL

## 2017-01-31 MED ORDER — CEPHALEXIN 500 MG PO CAPS: 500.0000 mg | ORAL_CAPSULE | Freq: Two times a day (BID) | ORAL | 0 refills | Status: AC

## 2017-01-31 MED ORDER — FENTANYL CITRATE (PF) 100 MCG/2ML IJ SOLN
25.0000 ug | Freq: Once | INTRAMUSCULAR | Status: AC
  Administered 2017-01-31: 25 ug via INTRAVENOUS
  Filled 2017-01-31: qty 2

## 2017-01-31 MED ORDER — DEXTROSE 5 % IV SOLN
1.0000 g | Freq: Once | INTRAVENOUS | Status: AC
  Administered 2017-01-31: 1 g via INTRAVENOUS
  Filled 2017-01-31: qty 10

## 2017-01-31 MED ORDER — IOPAMIDOL (ISOVUE-300) INJECTION 61%: 100.0000 mL | Freq: Once | INTRAVENOUS | Status: DC | PRN

## 2017-01-31 MED ORDER — SODIUM CHLORIDE 0.9 % IV BOLUS (SEPSIS)
1000.0000 mL | Freq: Once | INTRAVENOUS | Status: AC
  Administered 2017-01-31: 1000 mL via INTRAVENOUS

## 2017-01-31 MED ORDER — KETOROLAC TROMETHAMINE 30 MG/ML IJ SOLN
15.0000 mg | Freq: Once | INTRAMUSCULAR | Status: AC
  Administered 2017-01-31: 15 mg via INTRAVENOUS
  Filled 2017-01-31: qty 1

## 2017-01-31 NOTE — Discharge Instructions (Signed)
Please be sure to monitor your condition carefully, drink plenty of fluids and get plenty of rest. Return here for concerning changes in your condition.

## 2017-01-31 NOTE — ED Notes (Signed)
CRITICAL VALUE ALERT  Critical Value:  2.52  Date & Time Notied:  02/10/17 1842  Provider Notified: Dr. Jeraldine LootsLockwood  Orders Received/Actions taken: No orders given at this time

## 2017-01-31 NOTE — ED Triage Notes (Signed)
Pt being treated for a kidney infection.  Seen at Urgent care and was told to come to ER if she developed a fever or started vomiting.  Pt vomited times one.  C/o fever today.  Last dose of tylenol taken this morning.

## 2017-01-31 NOTE — ED Provider Notes (Signed)
[facility] Provider Note   CSN: [id number] Arrival date & time: 01/31/17  1655     History   Chief Complaint Chief Complaint  Patient presents with  . Fever    HPI Jordan Jackson is a 21 y.o. female.  HPI  Generally well young female presents with concern of ongoing fever, left flank pain. Illness began about 5 days ago, was mild until about 36 hours ago, the patient developed a fever, and worsening pain in the left flank. Since that time the patient's pain has been persistent in spite of OTC medication. Patient went to urgent care, was diagnosed with pyelonephritis. She has been taking antibiotics as prescribed, but symptoms are worsening. There is associated nausea, anorexia, no vomiting, no diarrhea. No frank dysuria. Patient is here with her mother the history of present illness.   Past Medical History:  Diagnosis Date  . Contraceptive management 11/15/2015  . Fatty liver 10/24/2016   Refer to GI  . Gastritis    easinophilic   . Irregular periods/menstrual cycles 08/10/2015  . Menstrual cramps 08/10/2015  . Pelvic pain in female     Patient Active Problem List   Diagnosis Date Noted  . RUQ pain 12/08/2016  . Alternating constipation and diarrhea 12/08/2016  . GERD (gastroesophageal reflux disease) 12/08/2016  . Rectal bleeding 12/08/2016  . Fatty liver 10/24/2016  . Contraceptive management 11/15/2015  . Irregular periods/menstrual cycles 08/10/2015  . Menstrual cramps 08/10/2015    Past Surgical History:  Procedure Laterality Date  . BIOPSY  12/26/2016   Procedure: BIOPSY;  Surgeon: West Bali, MD;  Location: [facility];  Service: Endoscopy;;  gastric duodenal  . COLONOSCOPY N/A 12/26/2016   Procedure: COLONOSCOPY;  Surgeon: West Bali, MD;  Location: [facility];  Service: Endoscopy;  Laterality: N/A;  1:00pm  . ESOPHAGOGASTRODUODENOSCOPY N/A 12/26/2016   Procedure: ESOPHAGOGASTRODUODENOSCOPY (EGD);  Surgeon: West Bali, MD;   Location: [facility];  Service: Endoscopy;  Laterality: N/A;  . NO PAST SURGERIES      OB History    Gravida Para Term Preterm AB Living   0 0 0 0 0 0   SAB TAB Ectopic Multiple Live Births   0 0 0 0         Home Medications    Prior to Admission medications   Medication Sig Start Date End Date Taking? Authorizing Provider  acetaminophen (TYLENOL) 500 MG tablet Take 1,000 mg by mouth every 6 (six) hours as needed for moderate pain.   Yes [provider]  ciprofloxacin (CIPRO) 500 MG tablet Take 1 tablet (500 mg total) by mouth 2 (two) times daily. 01/30/17 02/06/17 Yes Yu, Amy V, PA-C  fluconazole (DIFLUCAN) 150 MG tablet Take 1 tablet (150 mg total) by mouth daily. Take second dose 72 hours later if symptoms still persists. 01/30/17  Yes Yu, Amy V, PA-C  ibuprofen (ADVIL,MOTRIN) 200 MG tablet Take 400 mg by mouth every 6 (six) hours as needed for moderate pain.   Yes [provider]  LO LOESTRIN FE 1 MG-10 MCG / 10 MCG tablet TAKE 1 TABLET EVERY DAY 12/08/16  Yes Cyril Mourning A, NP  predniSONE (DELTASONE) 10 MG tablet Take 10 mg by mouth daily with breakfast.   Yes [provider]  cephALEXin (KEFLEX) 500 MG capsule Take 1 capsule (500 mg total) by mouth 2 (two) times daily. 01/31/17 02/05/17  Gerhard Munch, MD    Family History Family History  Problem Relation Age of  Onset  . Hypertension Father   . Diabetes Maternal Grandmother   . Hypertension Paternal Grandmother   . Hypertension Paternal Grandfather   . Diabetes Paternal Grandfather   . Inflammatory bowel disease Neg Hx   . Liver disease Neg Hx   . Celiac disease Neg Hx   . Colon cancer Neg Hx     Social History Social History  Substance Use Topics  . Smoking status: Never Smoker  . Smokeless tobacco: Never Used  . Alcohol use No     Allergies   Patient has no known allergies.   Review of Systems Review of Systems  Constitutional:       Per HPI, otherwise negative  HENT:        Per HPI, otherwise negative  Respiratory:       Per HPI, otherwise negative  Cardiovascular:       Per HPI, otherwise negative  Gastrointestinal: Negative for vomiting.  Endocrine:       Negative aside from HPI  Genitourinary:       Neg aside from HPI   Musculoskeletal:       Per HPI, otherwise negative  Skin: Negative.   Neurological: Negative for syncope.     Physical Exam Updated Vital Signs BP (!) 112/59 (BP Location: Right Arm)   Pulse 89   Temp 99.2 F (37.3 C) (Oral)   Resp 16   Ht 5\' 3"  (1.6 m)   Wt 104.3 kg (230 lb)   SpO2 99%   BMI 40.74 kg/m   Physical Exam  Constitutional: She is oriented to person, place, and time. She appears well-developed and well-nourished. No distress.  HENT:  Head: Normocephalic and atraumatic.  Eyes: Conjunctivae and EOM are normal.  Cardiovascular: Normal rate and regular rhythm.   Pulmonary/Chest: Effort normal and breath sounds normal. No stridor. No respiratory distress.  Abdominal: She exhibits no distension. There is no tenderness. There is no guarding.  Discomfort about the left flank, though the patient states that is a deeper pain sensation.  Musculoskeletal: She exhibits no edema.  Neurological: She is alert and oriented to person, place, and time. No cranial nerve deficit.  Skin: Skin is warm and dry.  Psychiatric: She has a normal mood and affect.  Nursing note and vitals reviewed.    ED Treatments / Results  Labs (all labs ordered are listed, but only abnormal results are displayed) Labs Reviewed  COMPREHENSIVE METABOLIC PANEL - Abnormal; Notable for the following:       Result Value   Potassium 3.1 (*)    CO2 20 (*)    Total Protein 8.6 (*)    All other components within normal limits  CBC WITH DIFFERENTIAL/PLATELET - Abnormal; Notable for the following:    WBC 17.3 (*)    Neutro Abs 13.2 (*)    Monocytes Absolute 2.3 (*)    All other components within normal limits  URINALYSIS, ROUTINE W REFLEX  MICROSCOPIC - Abnormal; Notable for the following:    Hgb urine dipstick MODERATE (*)    Ketones, ur 20 (*)    Bacteria, UA RARE (*)    Squamous Epithelial / LPF 0-5 (*)    All other components within normal limits  I-STAT CG4 LACTIC ACID, ED - Abnormal; Notable for the following:    Lactic Acid, Venous 2.52 (*)    All other components within normal limits  I-STAT BETA HCG BLOOD, ED ([facility], [facility], [facility] ONLY) - Abnormal; Notable for the following:    I-stat  hCG, quantitative 11.9 (*)    All other components within normal limits  URINE CULTURE    Procedures Procedures (including critical care time)  Medications Ordered in ED Medications  acetaminophen (TYLENOL) tablet 650 mg (650 mg Oral Given 01/31/17 1724)  sodium chloride 0.9 % bolus 1,000 mL (0 mLs Intravenous Stopped 01/31/17 2105)  cefTRIAXone (ROCEPHIN) 1 g in dextrose 5 % 50 mL IVPB (0 g Intravenous Stopped 01/31/17 2007)  fentaNYL (SUBLIMAZE) injection 25 mcg (25 mcg Intravenous Given 01/31/17 1932)  ketorolac (TORADOL) 30 MG/ML injection 15 mg (15 mg Intravenous Given 01/31/17 2104)     Initial Impression / Assessment and Plan / ED Course  I have reviewed the triage vital signs and the nursing notes.  Pertinent labs & imaging results that were available during my care of the patient were reviewed by me and considered in my medical decision making (see chart for details).    After the initial after the initial evaluation I reviewed the patient's chart including urgent care documentation from yesterday, and lab results from yesterdayositive for Escherichia coli on urine culture.  No sensitivities yet available.   9:37 PM After fluid resuscitation, Toradol, Tylenol, patient is essentially better, sitting upright, smiling. She did have mild headache, but this is not sustained. The CT scanner machine is not currently available With substantial improvement in her condition, no evidence for sepsis, with reduction in heart rate, fever,  discomfort, and after switching antibiotics, we had a conversation aboutrisks and benefits of staying for CT scan, when clinically the patient has pyelonephritis. After discussion with patient and her mother, the patient elected for discharge with close outpatient follow-up She and her mother acknowledged understanding ofreturn precautions, follow-up instructions.   Final Clinical Impressions(s) / ED Diagnoses   Final diagnoses:  Pyelonephritis    New Prescriptions New Prescriptions   CEPHALEXIN (KEFLEX) 500 MG CAPSULE    Take 1 capsule (500 mg total) by mouth 2 (two) times daily.     Gerhard MunchLockwood, Kealie Barrie, MD 01/31/17 2138

## 2017-02-01 LAB — URINE CULTURE: Culture: >=100000 — AB

## 2017-02-02 LAB — URINE CULTURE: Culture: NO GROWTH

## 2017-02-05 DIAGNOSIS — E872 Acidosis: Secondary | ICD-10-CM | POA: Diagnosis not present

## 2017-02-05 DIAGNOSIS — N10 Acute tubulo-interstitial nephritis: Secondary | ICD-10-CM | POA: Diagnosis not present

## 2017-02-05 DIAGNOSIS — Z3201 Encounter for pregnancy test, result positive: Secondary | ICD-10-CM | POA: Diagnosis not present

## 2017-02-05 DIAGNOSIS — E876 Hypokalemia: Secondary | ICD-10-CM | POA: Diagnosis not present

## 2017-02-13 ENCOUNTER — Ambulatory Visit (INDEPENDENT_AMBULATORY_CARE_PROVIDER_SITE_OTHER): Payer: 59 | Admitting: Gastroenterology

## 2017-02-13 ENCOUNTER — Encounter: Payer: Self-pay | Admitting: Gastroenterology

## 2017-02-13 VITALS — BP 136/80 | HR 85 | Temp 97.0°F | Ht 63.0 in | Wt 228.4 lb

## 2017-02-13 DIAGNOSIS — K5281 Eosinophilic gastritis or gastroenteritis: Secondary | ICD-10-CM | POA: Insufficient documentation

## 2017-02-13 DIAGNOSIS — K760 Fatty (change of) liver, not elsewhere classified: Secondary | ICD-10-CM

## 2017-02-13 NOTE — Progress Notes (Signed)
Primary Care Physician: Renaye Rakers, MD  Primary Gastroenterologist:  Jonette Eva, MD   Chief Complaint  Patient presents with  . fatt liver    HPI: Jordan Jackson is a 21 y.o. female here for follow-up. She was seen back in June for fatty liver. Referred by GYN, Cyril Mourning, NP. Abdominal ultrasound was done for chronic right upper quadrant pain. Also with heartburn. Nexium causes headaches. Alternating loose stools with hard stools. History of IBS. Rectal bleeding. Treated empirically with PPI and probiotics without notable improvement. EGD and colonoscopy done July 3. 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 Patient was given prednisone taper. Completed about 2 weeks ago. Colonoscopy her terminal ileum was normal, internal hemorrhoid seen, suspected alternating bowel habits due to IBS.  Patient states that her abdominal pain is much improved after taking steroid taper. She has had very little heartburn or abdominal pain. No longer on PPI. Bowel movements are much better as well. She has Bristol stool 1 proximally once per week and otherwise normal bowel movements. No blood in the stool or melena.  Since we last saw her she was in urgent care with urinary tract infection. Her symptoms progressed and was seen in the ED the following day diagnosed with pyelonephritis. Given IV Rocephin in the ED, antibiotics switched from Cipro to Keflex. Urine culture positive for Escherichia coli, pansensitive. Interestingly she had an hCG quantitative test of 11.9 while in the ED on August 8. Repeat labs on August 13 showed hCG less than 2.    Current Outpatient Prescriptions  Medication Sig Dispense Refill  . acetaminophen (TYLENOL) 500 MG tablet Take 1,000 mg by mouth every 6 (six) hours as needed for moderate pain.    Marland Kitchen ibuprofen (ADVIL,MOTRIN) 200 MG tablet Take 400 mg by mouth  every 6 (six) hours as needed for moderate pain.    . LO LOESTRIN FE 1 MG-10 MCG / 10 MCG tablet TAKE 1 TABLET EVERY DAY 84 tablet 0   No current facility-administered medications for this visit.     Allergies as of 02/13/2017  . (No Known Allergies)    ROS:  General: Negative for anorexia, weight loss, fever, chills, fatigue, weakness. ENT: Negative for hoarseness, difficulty swallowing , nasal congestion. CV: Negative for chest pain, angina, palpitations, dyspnea on exertion, peripheral edema.  Respiratory: Negative for dyspnea at rest, dyspnea on exertion, cough, sputum, wheezing.  GI: See history of present illness. GU:  Negative for dysuria, hematuria, urinary incontinence, urinary frequency, nocturnal urination.  Endo: Negative for unusual weight change.    Physical Examination:   BP 136/80   Pulse 85   Temp (!) 97 F (36.1 C) (Oral)   Ht 5\' 3"  (1.6 m)   Wt 228 lb 6.4 oz (103.6 kg)   BMI 40.46 kg/m   General: Well-nourished, well-developed in no acute distress.  Eyes: No icterus. Mouth: Oropharyngeal mucosa moist and pink , no lesions erythema or exudate. Lungs: Clear to auscultation bilaterally.  Heart: Regular rate and rhythm, no murmurs rubs or gallops.  Abdomen: Bowel sounds are normal, nontender, nondistended, no hepatosplenomegaly or masses, no abdominal bruits or hernia , no rebound or guarding.   Extremities: No lower extremity edema. No clubbing or deformities. Neuro: Alert and oriented x 4   Skin: Warm and dry, no jaundice.   Psych: Alert and cooperative, normal mood and affect.  Labs:  Lab Results  Component Value Date   CREATININE 0.85 01/31/2017   BUN 7 01/31/2017   NA 137 01/31/2017   K 3.1 (L) 01/31/2017   CL 106 01/31/2017   CO2 20 (L) 01/31/2017   Lab Results  Component Value Date   ALT 28 01/31/2017   AST 21 01/31/2017   ALKPHOS 77 01/31/2017   BILITOT 1.1 01/31/2017   Lab Results  Component Value Date   WBC 17.3 (H) 01/31/2017    HGB 13.0 01/31/2017   HCT 37.6 01/31/2017   MCV 86.2 01/31/2017   PLT 338 01/31/2017    hcg, quantitative 11.9+ on 01/31/17  Labs on 02/05/2017, glucose 116, BUN 10, creatinine 0.56, total bilirubin 0.3, alkaline phosphatase 87, AST 21, ALT mildly elevated at 36, albumin 3.8, white blood cell count 6100, hemoglobin 13.5, hematocrit 40.9, platelets 479,000.  Imaging Studies: No results found.

## 2017-02-13 NOTE — Assessment & Plan Note (Addendum)
Fatty liver noted on ultrasound dating back over 8 years ago. ALT minimally elevated. Patient reports 10 pound intentional weight loss.   Instructions for fatty liver: Recommend 1-2# weight loss per week until ideal body weight through exercise & diet. Low fat/cholesterol diet.   Avoid sweets, sodas, fruit juices, sweetened beverages like tea, etc. Gradually increase exercise from 15 min daily up to 1 hr per day 5 days/week. Limit alcohol use.  Return to the office in six months, will recheck labs at that time.   CONSIDER FURTHER SEROLOGIES AT THAT TIME ESPECIALLY IF ELEVATED AGAIN.

## 2017-02-13 NOTE — Progress Notes (Signed)
Please let patient know that when I looked back thru her chart, one thing I would consider at this time would be checking viral markers for hep b and c. I feel like she is very low risk and this would be to cover the bases.   If she is agreeable, let's check hep b surf antigen, hep b surf antibody, hep C antibody.  Find out if she was vaccinated for Hep B.

## 2017-02-13 NOTE — Patient Instructions (Addendum)
1. Return to the office in 07/2017. We will check liver blood test at that time.  2. Call if any problems in the meantime.   Instructions for fatty liver: Recommend 1-2# weight loss per week until ideal body weight through exercise & diet. Low fat/cholesterol diet.   Avoid sweets, sodas, fruit juices, sweetened beverages like tea, etc. Gradually increase exercise from 15 min daily up to 1 hr per day 5 days/week. Limit alcohol use.

## 2017-02-13 NOTE — Assessment & Plan Note (Signed)
Tapered off prednisone couple of weeks ago. Abdominal pain much improved and doing very well. Monitor for recurrent symptoms. Return to the office in six months or sooner if needed

## 2017-02-14 NOTE — Progress Notes (Signed)
CC'D TO PCP °

## 2017-02-14 NOTE — Progress Notes (Signed)
Tried to call pt. Mail box full and could not leave a message. Mailing a letter to call for results.

## 2017-02-15 ENCOUNTER — Telehealth: Payer: Self-pay

## 2017-02-15 ENCOUNTER — Other Ambulatory Visit: Payer: Self-pay

## 2017-02-15 DIAGNOSIS — K760 Fatty (change of) liver, not elsewhere classified: Secondary | ICD-10-CM

## 2017-02-15 NOTE — Telephone Encounter (Signed)
Pt called and told susan she wanted her labs to go to [facility] instead of [facility]. I have re-entered the labs for [facility].

## 2017-02-15 NOTE — Progress Notes (Signed)
PT called and is agreeable to doing the viral markers. 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  I told her she should have had it then and to just look up the documentation.

## 2017-02-17 DIAGNOSIS — K760 Fatty (change of) liver, not elsewhere classified: Secondary | ICD-10-CM | POA: Diagnosis not present

## 2017-02-18 LAB — HEPATITIS C ANTIBODY: Hep C Virus Ab: <0.1 s/co ratio (ref 0.0–0.9)

## 2017-02-18 LAB — HEPATITIS B SURFACE ANTIGEN: Hepatitis B Surface Ag: NEGATIVE

## 2017-02-18 LAB — HEPATITIS B SURFACE ANTIBODY,QUALITATIVE: Hep B Surface Ab, Qual: NONREACTIVE

## 2017-02-19 ENCOUNTER — Telehealth: Payer: Self-pay

## 2017-02-19 NOTE — Telephone Encounter (Signed)
[facility] results on Hepatitis labs received on fax and placed in file box for [place], [place].

## 2017-02-21 NOTE — Telephone Encounter (Signed)
noted 

## 2017-02-26 NOTE — Progress Notes (Signed)
Hep C negative.  If she was vaccinated for Hep B she IS NOT immune. 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 She may need to be revaccinated or get booster.

## 2017-02-27 NOTE — Progress Notes (Signed)
LMOM to call.

## 2017-04-30 ENCOUNTER — Ambulatory Visit: Payer: 59 | Admitting: Nurse Practitioner

## 2017-05-07 ENCOUNTER — Ambulatory Visit: Payer: 59 | Admitting: Gastroenterology

## 2017-05-29 ENCOUNTER — Ambulatory Visit (HOSPITAL_COMMUNITY)
Admission: EM | Admit: 2017-05-29 | Discharge: 2017-05-29 | Disposition: A | Discharge status: Home / Self Care | Payer: 59 | Attending: Family Medicine | Admitting: Family Medicine

## 2017-05-29 ENCOUNTER — Encounter (HOSPITAL_COMMUNITY): Payer: Self-pay | Admitting: Emergency Medicine

## 2017-05-29 ENCOUNTER — Other Ambulatory Visit: Payer: Self-pay

## 2017-05-29 DIAGNOSIS — N898 Other specified noninflammatory disorders of vagina: Secondary | ICD-10-CM | POA: Diagnosis present

## 2017-05-29 DIAGNOSIS — B373 Candidiasis of vulva and vagina: Secondary | ICD-10-CM | POA: Insufficient documentation

## 2017-05-29 DIAGNOSIS — N760 Acute vaginitis: Secondary | ICD-10-CM

## 2017-05-29 DIAGNOSIS — B9689 Other specified bacterial agents as the cause of diseases classified elsewhere: Secondary | ICD-10-CM | POA: Insufficient documentation

## 2017-05-29 LAB — POCT URINALYSIS DIP (DEVICE)
Bilirubin Urine: NEGATIVE
Glucose, UA: NEGATIVE mg/dL
Ketones, ur: NEGATIVE mg/dL
Nitrite: NEGATIVE
Protein, ur: NEGATIVE mg/dL
Specific Gravity, Urine: 1.025 (ref 1.005–1.030)
Urobilinogen, UA: 0.2 mg/dL (ref 0.0–1.0)
pH: 6 (ref 5.0–8.0)

## 2017-05-29 LAB — POCT PREGNANCY, URINE: Preg Test, Ur: NEGATIVE

## 2017-05-29 MED ORDER — FLUCONAZOLE 150 MG PO TABS: 150.0000 mg | ORAL_TABLET | Freq: Once | ORAL | 0 refills | Status: AC

## 2017-05-29 MED ORDER — METRONIDAZOLE 500 MG PO TABS: 500.0000 mg | ORAL_TABLET | Freq: Two times a day (BID) | ORAL | 0 refills | Status: DC

## 2017-05-29 NOTE — ED Triage Notes (Signed)
Pt states "ive had some itchy burning feeling in my vagina and its not getting better, also its sore. Today when I went to the bathroom I had some discharge. I also started to spot, I ended my period a week ago." Denies issues with urination.

## 2017-05-29 NOTE — ED Provider Notes (Signed)
[facility]   [id number] 05/29/17 Arrival Time: 1318   SUBJECTIVE:  Jordan Jackson is a [age] y.o. female who presents to the urgent care with complaint of itchy burning feeling in my vagina and its not getting better, also its sore. Today when I went to the bathroom I had some discharge. I also started to spot, I ended my period a week ago." Denies issues with urination.   One partner.  Some fishy odor.  No prior symptoms.  No abdominal pain.  Had pyelo 4 months ago.    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 Contraceptive management 11/15/2015  . Fatty liver 10/24/2016   Refer to GI  . Gastritis    easinophilic   . Irregular periods/menstrual cycles 08/10/2015  . Menstrual cramps 08/10/2015  . Pelvic pain in female    Family History  Problem Relation Age of Onset  . Hypertension Father   . Diabetes Maternal Grandmother   . Hypertension Paternal Grandmother   . Hypertension Paternal Grandfather   . Diabetes Paternal Grandfather   . Inflammatory bowel disease Neg Hx   . Liver disease Neg Hx   . Celiac disease Neg Hx   . Colon cancer Neg Hx    Social History   Socioeconomic History  . Marital status: Single    Spouse name: Not on file  . Number of children: Not on file  . Years of education: Not on file  . Highest education level: Not on file  Social Needs  . Financial resource strain: Not on file  . Food insecurity - worry: Not on file  . Food insecurity - inability: Not on file  . Transportation needs - medical: Not on file  . Transportation needs - non-medical: Not on file  Occupational History  . Occupation: Psychologist, sport and exercisenurse tech  Tobacco Use  . Smoking status: Never Smoker  . Smokeless tobacco: Never Used  Substance and Sexual Activity  . Alcohol use: No  . Drug use: No  . Sexual activity: Yes    Birth control/protection: Pill  Other Topics Concern  . Not on file  Social History Narrative  . Not on file   No outpatient  medications have been marked as taking for the 05/29/17 encounter [facility](Hospital Encounter).   No Known Allergies    ROS: As per HPI, remainder of ROS negative.   OBJECTIVE:   Vitals:   05/29/17 1336  BP: (!) 152/86  Pulse: (!) 117  Resp: 16  Temp: 98.2 F (36.8 C)  SpO2: 100%     General appearance: alert; no distress Eyes: PERRL; EOMI; conjunctiva normal HENT: normocephalic; atraumatic; , external ears normal without trauma; nasal mucosa normal; oral mucosa normal Neck: supple Abdomen: soft, non-tender; bowel sounds normal; no masses or organomegaly; no guarding or rebound tenderness Back: no CVA tenderness Extremities: no cyanosis or edema; symmetrical with no gross deformities Skin: warm and dry Neurologic: normal gait; grossly normal Psychological: alert and cooperative; normal mood and affect      Labs:  Results for orders placed or performed during the hospital encounter of 05/29/17  POCT urinalysis dip (device)  Result Value Ref Range   Glucose, UA NEGATIVE NEGATIVE mg/dL   Bilirubin Urine NEGATIVE NEGATIVE   Ketones, ur NEGATIVE NEGATIVE mg/dL   Specific Gravity, Urine 1.025 1.005 - 1.030   Hgb urine dipstick SMALL (A) NEGATIVE   pH 6.0 5.0 - 8.0   Protein, ur NEGATIVE NEGATIVE mg/dL   Urobilinogen, UA  0.2 0.0 - 1.0 mg/dL   Nitrite NEGATIVE NEGATIVE   Leukocytes, UA TRACE (A) NEGATIVE    Labs Reviewed  POCT URINALYSIS DIP (DEVICE) - Abnormal; Notable for the following components:      Result Value   Hgb urine dipstick SMALL (*)    Leukocytes, UA TRACE (*)    All other components within normal limits  URINE CULTURE  URINE CYTOLOGY ANCILLARY ONLY    No results found.     ASSESSMENT & PLAN:  1. Acute vaginitis     Meds ordered this encounter  Medications  . fluconazole (DIFLUCAN) 150 MG tablet    Sig: Take 1 tablet (150 mg total) by mouth once for 1 dose. Repeat if needed    Dispense:  2 tablet    Refill:  0  . metroNIDAZOLE (FLAGYL) 500  MG tablet    Sig: Take 1 tablet (500 mg total) by mouth 2 (two) times daily.    Dispense:  14 tablet    Refill:  0    Reviewed expectations re: course of current medical issues. Questions answered. Outlined signs and symptoms indicating need for more acute intervention. Patient verbalized understanding. After Visit Summary given.    Procedures:      Elvina SidleLauenstein, Tayson Schnelle, MD 05/29/17 [id number]

## 2017-05-30 LAB — URINE CYTOLOGY ANCILLARY ONLY
Chlamydia: NEGATIVE
Neisseria Gonorrhea: NEGATIVE
Trichomonas: NEGATIVE

## 2017-05-30 LAB — URINE CULTURE

## 2017-06-01 LAB — URINE CYTOLOGY ANCILLARY ONLY
Bacterial vaginitis: NEGATIVE
Candida vaginitis: POSITIVE — AB

## 2017-06-28 ENCOUNTER — Encounter: Payer: Self-pay | Admitting: Gastroenterology

## 2017-08-20 DIAGNOSIS — H5500 Unspecified nystagmus: Secondary | ICD-10-CM | POA: Diagnosis not present

## 2017-08-20 DIAGNOSIS — R7309 Other abnormal glucose: Secondary | ICD-10-CM | POA: Diagnosis not present

## 2017-08-20 DIAGNOSIS — E6609 Other obesity due to excess calories: Secondary | ICD-10-CM | POA: Diagnosis not present

## 2017-08-20 DIAGNOSIS — N912 Amenorrhea, unspecified: Secondary | ICD-10-CM | POA: Diagnosis not present

## 2017-08-23 ENCOUNTER — Ambulatory Visit (HOSPITAL_COMMUNITY)
Admission: RE | Admit: 2017-08-23 | Discharge: 2017-08-23 | Disposition: A | Discharge status: Home / Self Care | Payer: 59 | Source: Ambulatory Visit | Attending: Family Medicine | Admitting: Family Medicine

## 2017-08-23 DIAGNOSIS — I10 Essential (primary) hypertension: Secondary | ICD-10-CM | POA: Insufficient documentation

## 2017-08-27 ENCOUNTER — Other Ambulatory Visit (HOSPITAL_COMMUNITY): Payer: Self-pay | Admitting: Family Medicine

## 2017-08-27 DIAGNOSIS — I10 Essential (primary) hypertension: Secondary | ICD-10-CM

## 2017-11-12 ENCOUNTER — Other Ambulatory Visit: Payer: Self-pay | Admitting: Adult Health

## 2018-04-09 ENCOUNTER — Other Ambulatory Visit: Payer: 59 | Admitting: Adult Health

## 2018-04-25 ENCOUNTER — Encounter: Payer: Self-pay | Admitting: Adult Health

## 2018-04-25 ENCOUNTER — Ambulatory Visit (INDEPENDENT_AMBULATORY_CARE_PROVIDER_SITE_OTHER): Payer: 59 | Admitting: Adult Health

## 2018-04-25 VITALS — BP 127/80 | HR 76 | Ht 63.0 in | Wt 245.2 lb

## 2018-04-25 DIAGNOSIS — N644 Mastodynia: Secondary | ICD-10-CM

## 2018-04-25 DIAGNOSIS — N898 Other specified noninflammatory disorders of vagina: Secondary | ICD-10-CM

## 2018-04-25 DIAGNOSIS — Z01419 Encounter for gynecological examination (general) (routine) without abnormal findings: Secondary | ICD-10-CM

## 2018-04-25 DIAGNOSIS — Z3041 Encounter for surveillance of contraceptive pills: Secondary | ICD-10-CM

## 2018-04-25 DIAGNOSIS — Z01411 Encounter for gynecological examination (general) (routine) with abnormal findings: Secondary | ICD-10-CM

## 2018-04-25 MED ORDER — FLUCONAZOLE 150 MG PO TABS: ORAL_TABLET | ORAL | 1 refills | Status: DC

## 2018-04-25 MED ORDER — NORETHIN-ETH ESTRAD-FE BIPHAS 1 MG-10 MCG / 10 MCG PO TABS: 1.0000 | ORAL_TABLET | Freq: Every day | ORAL | 4 refills | Status: DC

## 2018-04-25 NOTE — Progress Notes (Signed)
Patient ID: Jordan Jackson, female   DOB: 1995-09-23, 23 y.o.   MRN: [id number] History of Present Illness:  Jordan Jackson is a 22 year old black female in for well woman gyn exam, she had normal pap 11/15/15. PCP is Dr Parke Simmers.  Current Medications, Allergies, Past Medical History, Past Surgical History, Family History and Social History were reviewed in [employer] record.     Review of Systems:  Patient denies any headaches, hearing loss, fatigue, blurred vision, shortness of breath, chest pain, abdominal pain, problems with bowel movements, urination, or intercourse. No joint pain or mood swings. Vaginal itch at times Right breast tenderness, has seen clear discharged during exam No period with OCs   Physical Exam:BP 127/80 (BP Location: Left Arm, Patient Position: Sitting, Cuff Size: Normal)   Pulse 76   Ht 5\' 3"  (1.6 m)   Wt 245 lb 3.2 oz (111.2 kg)   LMP 10/20/2017 (Exact Date)   BMI 43.44 kg/m  General:  Well developed, well nourished, no acute distress Skin:  Warm and dry Neck:  Midline trachea, normal thyroid, good ROM, no lymphadenopathy Lungs; Clear to auscultation bilaterally Breast:  No dominant palpable mass, retraction, or nipple discharge, but has tenderness outer quadrant of right breast, and point tenderness at about 9 0'clock, but no masses felt Cardiovascular: Regular rate and rhythm Abdomen:  Soft, non tender, no hepatosplenomegaly Pelvic:  External genitalia is normal in appearance, no lesions.  The vagina is normal in appearance. Urethra has no lesions or masses. The cervix is smooth.  Uterus is felt to be normal size, shape, and contour.  No adnexal masses or tenderness noted.Bladder is non tender, no masses felt. Extremities/musculoskeletal:  No swelling or varicosities noted, no clubbing or cyanosis Psych:  No mood changes, alert and cooperative,seems happy PHQ 2 score 0. Examination chaperoned by Marchelle Folks Rash, LPN. Will try diflucan to see if  helps with itching. Will get right breat [place].  Impression: 1. Encounter for well woman exam with routine gynecological exam   2. Encounter for surveillance of contraceptive pills   3. Breast tenderness   4. Itching in the vaginal area       Plan: Meds ordered this encounter  Medications  . Norethindrone-Ethinyl Estradiol-Fe Biphas (LO LOESTRIN FE) 1 MG-10 MCG / 10 MCG tablet    Sig: Take 1 tablet by mouth daily.    Dispense:  84 tablet    Refill:  4    Order Specific Question:   Supervising Provider    Answer:   Despina Hidden, LUTHER H [[id number]]  . fluconazole (DIFLUCAN) 150 MG tablet    Sig: Take 1 now and repeat in 3 days    Dispense:  2 tablet    Refill:  1    Order Specific Question:   Supervising Provider    Answer:   Lazaro Arms [[id number]]  Right breast US scheduled at [facility] 11/12/at 3:30 pm Pap and physical in 1 year Decrease caffeine, do not wear under wire, try sports bra, use tylenol or advil

## 2018-05-02 DIAGNOSIS — L308 Other specified dermatitis: Secondary | ICD-10-CM | POA: Diagnosis not present

## 2018-05-07 ENCOUNTER — Ambulatory Visit (HOSPITAL_COMMUNITY): Admission: RE | Admit: 2018-05-07 | Payer: 59 | Source: Ambulatory Visit

## 2018-12-03 DIAGNOSIS — M7661 Achilles tendinitis, right leg: Secondary | ICD-10-CM | POA: Diagnosis not present

## 2018-12-03 DIAGNOSIS — M722 Plantar fascial fibromatosis: Secondary | ICD-10-CM | POA: Diagnosis not present

## 2018-12-31 ENCOUNTER — Other Ambulatory Visit: Payer: Self-pay | Admitting: Adult Health

## 2019-03-19 DIAGNOSIS — N970 Female infertility associated with anovulation: Secondary | ICD-10-CM | POA: Diagnosis not present

## 2019-03-19 DIAGNOSIS — Z6841 Body Mass Index (BMI) 40.0 and over, adult: Secondary | ICD-10-CM | POA: Diagnosis not present

## 2019-03-19 DIAGNOSIS — Z124 Encounter for screening for malignant neoplasm of cervix: Secondary | ICD-10-CM | POA: Diagnosis not present

## 2019-03-19 DIAGNOSIS — Z113 Encounter for screening for infections with a predominantly sexual mode of transmission: Secondary | ICD-10-CM | POA: Diagnosis not present

## 2019-03-19 DIAGNOSIS — Z01419 Encounter for gynecological examination (general) (routine) without abnormal findings: Secondary | ICD-10-CM | POA: Diagnosis not present

## 2019-03-19 DIAGNOSIS — Z130 Encounter for screening for diseases of the blood and blood-forming organs and certain disorders involving the immune mechanism: Secondary | ICD-10-CM | POA: Diagnosis not present

## 2019-03-19 DIAGNOSIS — Z3009 Encounter for other general counseling and advice on contraception: Secondary | ICD-10-CM | POA: Diagnosis not present

## 2019-03-19 DIAGNOSIS — N926 Irregular menstruation, unspecified: Secondary | ICD-10-CM | POA: Diagnosis not present

## 2019-03-19 DIAGNOSIS — Z1389 Encounter for screening for other disorder: Secondary | ICD-10-CM | POA: Diagnosis not present

## 2019-03-19 LAB — HM PAP SMEAR

## 2019-03-24 DIAGNOSIS — Z23 Encounter for immunization: Secondary | ICD-10-CM | POA: Diagnosis not present

## 2019-04-25 DIAGNOSIS — L308 Other specified dermatitis: Secondary | ICD-10-CM | POA: Diagnosis not present

## 2019-04-29 ENCOUNTER — Other Ambulatory Visit: Payer: 59 | Admitting: Adult Health

## 2019-05-01 DIAGNOSIS — N898 Other specified noninflammatory disorders of vagina: Secondary | ICD-10-CM | POA: Diagnosis not present

## 2019-05-20 DIAGNOSIS — B373 Candidiasis of vulva and vagina: Secondary | ICD-10-CM | POA: Diagnosis not present

## 2019-05-20 DIAGNOSIS — Z3046 Encounter for surveillance of implantable subdermal contraceptive: Secondary | ICD-10-CM | POA: Diagnosis not present

## 2019-05-20 DIAGNOSIS — Z3202 Encounter for pregnancy test, result negative: Secondary | ICD-10-CM | POA: Diagnosis not present

## 2019-06-05 DIAGNOSIS — Z3046 Encounter for surveillance of implantable subdermal contraceptive: Secondary | ICD-10-CM | POA: Diagnosis not present

## 2019-06-05 DIAGNOSIS — Z23 Encounter for immunization: Secondary | ICD-10-CM | POA: Diagnosis not present

## 2019-06-10 ENCOUNTER — Other Ambulatory Visit: Payer: Self-pay

## 2019-06-10 ENCOUNTER — Ambulatory Visit: Payer: 59 | Attending: Internal Medicine

## 2019-06-10 DIAGNOSIS — Z20828 Contact with and (suspected) exposure to other viral communicable diseases: Secondary | ICD-10-CM | POA: Diagnosis not present

## 2019-06-10 DIAGNOSIS — Z20822 Contact with and (suspected) exposure to covid-19: Secondary | ICD-10-CM

## 2019-06-11 LAB — NOVEL CORONAVIRUS, NAA: SARS-CoV-2, NAA: NOT DETECTED

## 2020-03-02 DIAGNOSIS — L292 Pruritus vulvae: Secondary | ICD-10-CM | POA: Diagnosis not present

## 2020-03-02 DIAGNOSIS — N898 Other specified noninflammatory disorders of vagina: Secondary | ICD-10-CM | POA: Diagnosis not present

## 2020-03-22 ENCOUNTER — Ambulatory Visit
Admission: RE | Admit: 2020-03-22 | Discharge: 2020-03-22 | Disposition: A | Discharge status: Home / Self Care | Payer: 59 | Source: Ambulatory Visit | Attending: Nurse Practitioner | Admitting: Nurse Practitioner

## 2020-03-22 ENCOUNTER — Other Ambulatory Visit: Payer: Self-pay | Admitting: Nurse Practitioner

## 2020-03-22 DIAGNOSIS — M549 Dorsalgia, unspecified: Secondary | ICD-10-CM

## 2020-03-22 DIAGNOSIS — L83 Acanthosis nigricans: Secondary | ICD-10-CM | POA: Diagnosis not present

## 2020-03-22 DIAGNOSIS — M545 Low back pain, unspecified: Secondary | ICD-10-CM

## 2020-03-22 DIAGNOSIS — Z7189 Other specified counseling: Secondary | ICD-10-CM | POA: Diagnosis not present

## 2020-03-22 DIAGNOSIS — M542 Cervicalgia: Secondary | ICD-10-CM | POA: Diagnosis not present

## 2020-03-22 DIAGNOSIS — E6609 Other obesity due to excess calories: Secondary | ICD-10-CM | POA: Diagnosis not present

## 2020-03-22 DIAGNOSIS — N912 Amenorrhea, unspecified: Secondary | ICD-10-CM | POA: Diagnosis not present

## 2020-03-22 DIAGNOSIS — M546 Pain in thoracic spine: Secondary | ICD-10-CM | POA: Diagnosis not present

## 2020-05-18 DIAGNOSIS — Z3046 Encounter for surveillance of implantable subdermal contraceptive: Secondary | ICD-10-CM | POA: Diagnosis not present

## 2020-05-18 DIAGNOSIS — Z1389 Encounter for screening for other disorder: Secondary | ICD-10-CM | POA: Diagnosis not present

## 2020-05-18 DIAGNOSIS — Z01419 Encounter for gynecological examination (general) (routine) without abnormal findings: Secondary | ICD-10-CM | POA: Diagnosis not present

## 2020-05-18 DIAGNOSIS — Z130 Encounter for screening for diseases of the blood and blood-forming organs and certain disorders involving the immune mechanism: Secondary | ICD-10-CM | POA: Diagnosis not present

## 2020-05-18 DIAGNOSIS — Z6841 Body Mass Index (BMI) 40.0 and over, adult: Secondary | ICD-10-CM | POA: Diagnosis not present

## 2020-05-28 ENCOUNTER — Other Ambulatory Visit: Payer: Self-pay | Admitting: Dermatology

## 2020-05-28 DIAGNOSIS — L308 Other specified dermatitis: Secondary | ICD-10-CM | POA: Diagnosis not present

## 2020-07-09 ENCOUNTER — Ambulatory Visit
Admission: EM | Admit: 2020-07-09 | Discharge: 2020-07-09 | Disposition: A | Discharge status: Home / Self Care | Payer: 59 | Attending: Family Medicine | Admitting: Family Medicine

## 2020-07-09 ENCOUNTER — Telehealth: Payer: Self-pay | Admitting: Family Medicine

## 2020-07-09 DIAGNOSIS — S39012A Strain of muscle, fascia and tendon of lower back, initial encounter: Secondary | ICD-10-CM

## 2020-07-09 MED ORDER — KETOROLAC TROMETHAMINE 30 MG/ML IJ SOLN
30.0000 mg | Freq: Once | INTRAMUSCULAR | Status: AC
  Administered 2020-07-09: 30 mg via INTRAMUSCULAR

## 2020-07-09 MED ORDER — CYCLOBENZAPRINE HCL 5 MG PO TABS: 5.0000 mg | ORAL_TABLET | Freq: Three times a day (TID) | ORAL | 0 refills | Status: DC | PRN

## 2020-07-09 NOTE — Telephone Encounter (Signed)
Sending in flexeril for muscle relaxant

## 2020-07-09 NOTE — ED Triage Notes (Signed)
Pt presents with c/o low back pain that began yesterday. Moved furniture a few days ago

## 2020-07-09 NOTE — ED Provider Notes (Addendum)
Renaldo Fiddler    CSN: [id number] Arrival date & time: 07/09/20  1248      History   Chief Complaint Chief Complaint  Patient presents with  . Back Pain    HPI GILBERTE GORLEY is a 25 y.o. female.   Patient is a 25 year old female who presents today with lower back pain.  This started yesterday.  Started after moving a futon.  Denies any specific injuries.  Describes the pain as pulling and stretching.  Worse with bending over, certain movements like sitting or standing.  Pain radiates into buttocks and legs.  No numbness or tingling.  No loss of bowel or bladder function or saddle paresthesias.  No urinary symptoms or fever. Taking ibuprofen and using heat without much relief. Reviewed x ray from September of the lumbar spine which were normal.      Past Medical History:  Diagnosis Date  . Contraceptive management 11/15/2015  . Fatty liver 10/24/2016   Refer to GI  . Gastritis    easinophilic   . Irregular periods/menstrual cycles 08/10/2015  . Menstrual cramps 08/10/2015  . Pelvic pain in female     Patient Active Problem List   Diagnosis Date Noted  . 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 RUQ pain 12/08/2016  . Alternating constipation and diarrhea 12/08/2016  . GERD (gastroesophageal reflux disease) 12/08/2016  . Rectal bleeding 12/08/2016  . Fatty liver 10/24/2016  . Contraceptive management 11/15/2015  . Irregular periods/menstrual cycles 08/10/2015  . Menstrual cramps 08/10/2015    Past Surgical History:  Procedure Laterality Date  . BIOPSY  12/26/2016   Procedure: BIOPSY;  Surgeon: West Bali, MD;  [facility];  Service: Endoscopy;;  gastric duodenal  . COLONOSCOPY N/A 12/26/2016   Procedure: COLONOSCOPY;  Surgeon: West Bali, MD;  Location: [facility];  Service: Endoscopy;  Laterality: N/A;  1:00pm  . ESOPHAGOGASTRODUODENOSCOPY N/A 12/26/2016   Procedure: ESOPHAGOGASTRODUODENOSCOPY (EGD);  Surgeon: West Bali, MD;  Location: [facility];  Service: Endoscopy;  Laterality: N/A;  . NO PAST SURGERIES      OB History    Gravida  0   Para  0   Term  0   Preterm  0   AB  0   Living  0     SAB  0   IAB  0   Ectopic  0   Multiple  0   Live Births               Home Medications    Prior to Admission medications   Medication Sig Start Date End Date Taking? Authorizing Provider  cyclobenzaprine (FLEXERIL) 5 MG tablet Take 1 tablet (5 mg total) by mouth 3 (three) times daily as needed for muscle spasms. 07/09/20   Dahlia Byes A, NP  fluconazole (DIFLUCAN) 150 MG tablet Take 1 now and repeat in 3 days 04/25/18   Adline Potter, NP  LO LOESTRIN FE 1 MG-10 MCG / 10 MCG tablet TAKE 1 TABLET BY MOUTH EVERY DAY 01/01/19   Adline Potter, NP    Family History Family History  Problem Relation Age of Onset  . Hypertension Father   . Diabetes Maternal Grandmother   . Hypertension Paternal Grandmother   . Hypertension Paternal Grandfather   . Diabetes Paternal Grandfather   . Inflammatory bowel disease Neg Hx   . Liver disease Neg Hx   . Celiac disease Neg Hx   . Colon cancer Neg Hx  Social History Social History   Tobacco Use  . Smoking status: Never Smoker  . Smokeless tobacco: Never Used  Vaping Use  . Vaping Use: Never used  Substance Use Topics  . Alcohol use: No  . Drug use: No     Allergies   Patient has no known allergies.   Review of Systems Review of Systems   Physical Exam Triage Vital Signs ED Triage Vitals  Enc Vitals Group     BP 07/09/20 1348 121/88     Pulse Rate 07/09/20 1348 (!) 103     Resp 07/09/20 1348 18     Temp 07/09/20 1348 98.9 F (37.2 C)     Temp src --      SpO2 07/09/20 1348 98 %     Weight --      Height --      Head Circumference --      Peak Flow --      Pain Score 07/09/20 1347 5     Pain Loc --      Pain Edu? --      Excl. in GC? --    No data found.  Updated Vital Signs BP 121/88   Pulse (!) 103   Temp 98.9 F  (37.2 C)   Resp 18   SpO2 98%   Visual Acuity Right Eye Distance:   Left Eye Distance:   Bilateral Distance:    Right Eye Near:   Left Eye Near:    Bilateral Near:     Physical Exam Vitals and nursing note reviewed.  Constitutional:      General: She is not in acute distress.    Appearance: Normal appearance. She is not ill-appearing, toxic-appearing or diaphoretic.  HENT:     Head: Normocephalic.  Eyes:     Conjunctiva/sclera: Conjunctivae normal.  Pulmonary:     Effort: Pulmonary effort is normal.  Musculoskeletal:        General: Normal range of motion.     Cervical back: Normal range of motion.       Back:     Comments: TTP  Skin:    General: Skin is warm and dry.     Findings: No rash.  Neurological:     Mental Status: She is alert.  Psychiatric:        Mood and Affect: Mood normal.      UC Treatments / Results  Labs (all labs ordered are listed, but only abnormal results are displayed) Labs Reviewed - No data to display  EKG   Radiology No results found.  Procedures Procedures (including critical care time)  Medications Ordered in UC Medications  ketorolac (TORADOL) 30 MG/ML injection 30 mg (30 mg Intramuscular Given 07/09/20 1421)    Initial Impression / Assessment and Plan / UC Course  I have reviewed the triage vital signs and the nursing notes.  Pertinent labs & imaging results that were available during my care of the patient were reviewed by me and considered in my medical decision making (see chart for details).     Lumbar strain Nothing concerning on exam.  No red flags. Toradol given here for pain.  Flexeril prescribed for muscle laxation.  She can continue ibuprofen and Tylenol as needed Alternating heat and ice and stretching Follow up as needed for continued or worsening symptoms  Final Clinical Impressions(s) / UC Diagnoses   Final diagnoses:  Lumbar strain, initial encounter     Discharge Instructions     Toradol  given here for pain.  He can continue the ibuprofen and Tylenol as needed.  Flexeril for muscle laxation as needed. Information given in your packet on stretches to do Alternate heat and ice. Follow up as needed for continued or worsening symptoms      ED Prescriptions    None     PDMP not reviewed this encounter.   Janace Aris, NP 07/12/20 1436    Dahlia Byes A, NP 07/12/20 1437

## 2020-07-09 NOTE — Discharge Instructions (Addendum)
Toradol given here for pain.  He can continue the ibuprofen and Tylenol as needed.  Flexeril for muscle laxation as needed. Information given in your packet on stretches to do Alternate heat and ice. Follow up as needed for continued or worsening symptoms

## 2020-09-17 ENCOUNTER — Other Ambulatory Visit: Payer: Self-pay | Admitting: Orthopedic Surgery

## 2021-01-21 ENCOUNTER — Other Ambulatory Visit: Payer: Self-pay

## 2021-01-21 ENCOUNTER — Encounter: Payer: No Typology Code available for payment source | Attending: Obstetrics and Gynecology | Admitting: Dietician

## 2021-01-21 ENCOUNTER — Encounter: Payer: Self-pay | Admitting: Dietician

## 2021-01-21 VITALS — Ht 63.0 in | Wt 258.0 lb

## 2021-01-21 DIAGNOSIS — Z6841 Body Mass Index (BMI) 40.0 and over, adult: Secondary | ICD-10-CM | POA: Diagnosis not present

## 2021-01-21 NOTE — Progress Notes (Signed)
Medical Nutrition Therapy: Visit start time: 1330  end time: 1430  Assessment:  Diagnosis: obesity Past medical history: PCOS Psychosocial issues/ stress concerns: none  Preferred learning method:  Auditory/ discussion Sports coach   Current weight: 258.2lbs Height: 5'3" BMI: 45.7 Medications, supplements: reconciled list in medical record; currently only takes Metformin  Progress and evaluation:  Patient reports gradual increase in weight.  Made healthy lifestyle changes for weight loss in 2017 and lost about 50lbs, but has gradually regained Was diagnosed with PCOS 10/2020. Recent HbA1C was 5.7% (12/09/20) She reports having frequent food cravings, and not feeling sense of fulness when eating. She would like help in managing these symptoms.  She has been working to reduce sugary drinks and increase vegetables and fruits. Patient would like structured meal plan to feel assured she is following a healthy pattern. Due to her schedule of working 3 12-hour night shifts weekly, a consistent eating pattern is a challenge, and she often goes longer hours without eating, then finds it hard to stop eating when she starts.   Physical activity: no regular activity at this time  Dietary Intake:  Usual eating pattern includes 1-2 meals and 2-3 snacks per day. Dining out frequency: 2-3 meals per week.  Breakfast: usually none, esp after working Snack: none Lunch: none Snack: processed frozen microwave foods; sweet snacks Supper: limits fried foods to 1-2x a week, otherwise baked/ grilled meat + potatoes or rice + veg ie green beans or greens canned Snack: ice cream about 3x a week 3-4am break at work [employer] or other sandwich with tomato or broccoli soup Beverages: water, fruit juices, minute maid, [place] fruit flavors  Nutrition Care Education: Topics covered:  Basic nutrition: basic food groups, appropriate nutrient balance, appropriate meal and snack schedule, general  nutrition guidelines    Weight control: importance of low sugar and low fat choices; portion control strategies including using smaller plates, measuring foods, eating slowly and avoiding distractions, using low carb veg to fill plate/ stretch portions of other foods; estimated energy needs for weight loss at 1400kcal, provided guidance for 45% CHO, 25% pro, 30% fat; discussed tracking food intake; role of physical activity; managing food cravings/ appetite PCOS:  appropriate meal and snack schedule; appropriate carb intake and balance, healthy carb choices, role of fiber, protein, fat; role pf physical activity   Nutritional Diagnosis:  [place]-2.2 Altered nutrition-related laboratory As related to PCOS.  As evidenced by elevated HbA1C. [place]-3.3 Overweight/obesity As related to history of excess calories, inadequate physical activity, PCOS.  As evidenced by patient with current BMI of 45.7.  Intervention:  Instruction and discussion as noted above. Patient has begun to work on positive lifestyle changes and has had success with weight loss efforts in the past.  Established goals for change with direction from patient.  She will plan to schedule follow up MNT later as needed. Has MD appointment 01/2021.  Education Materials given:  Designer, industrial/product with food lists, sample meal pattern/ [employer] Tips for Managing Food Cravings  Visit summary with goals/ instructions to be viewed via MyChart   Learner/ who was taught:  Patient    Level of understanding: Verbalizes/ demonstrates competency   Demonstrated degree of understanding via:   Teach back Learning barriers: None  Willingness to learn/ readiness for change: Eager, change in progress   Monitoring and Evaluation:  Dietary intake, exercise, BG control, and body weight      follow up: prn

## 2021-01-21 NOTE — Patient Instructions (Signed)
Control food portions, especially starchy foods by measuring/ weighing foods, adjusting gradually to meet goal. Consider using more low carb veggies to fill the plate or mix with starches like rice, pasta, or potatoes to stretch the starch portion.  Start some regular activity/ exercise -- OK to start with shorter amount of time and gradually increase time +/or intensity or frequency Consider tracking food intake using a free app such as MyFitnessPal, LoseIt, or Sparkpeople.  Plan to have a meal or snack every 3-5 hours during awake time. When working at night, meals can be smaller and lighter.  A protein shake or low sugar breakfast drink (like Carnation) can be a substitute for 1 meal or snack up to once daily.

## 2021-02-23 ENCOUNTER — Other Ambulatory Visit: Payer: Self-pay

## 2021-02-23 MED ORDER — FLUOCINOLONE ACETONIDE SCALP 0.01 % EX OIL
TOPICAL_OIL | CUTANEOUS | 3 refills | Status: DC
  Filled 2021-02-23: qty 118.28, 30d supply, fill #0

## 2021-02-23 MED ORDER — DOXYCYCLINE HYCLATE 100 MG PO CAPS
ORAL_CAPSULE | ORAL | 3 refills | Status: DC
  Filled 2021-02-23: qty 60, 30d supply, fill #0

## 2021-02-23 MED ORDER — CLINDAMYCIN PHOSPHATE 1 % EX SOLN
CUTANEOUS | 99 refills | Status: DC
  Filled 2021-02-23: qty 60, 20d supply, fill #0
  Filled 2021-03-07: qty 60, 30d supply, fill #0

## 2021-02-25 ENCOUNTER — Other Ambulatory Visit: Payer: Self-pay | Admitting: Family Medicine

## 2021-02-25 ENCOUNTER — Other Ambulatory Visit: Payer: Self-pay

## 2021-02-25 ENCOUNTER — Ambulatory Visit
Admission: RE | Admit: 2021-02-25 | Discharge: 2021-02-25 | Disposition: A | Discharge status: Home / Self Care | Payer: No Typology Code available for payment source | Source: Ambulatory Visit | Attending: Family Medicine | Admitting: Family Medicine

## 2021-02-25 DIAGNOSIS — R590 Localized enlarged lymph nodes: Secondary | ICD-10-CM

## 2021-03-07 ENCOUNTER — Other Ambulatory Visit: Payer: Self-pay

## 2021-03-07 MED ORDER — METFORMIN HCL ER 500 MG PO TB24
1000.0000 mg | ORAL_TABLET | Freq: Two times a day (BID) | ORAL | 2 refills | Status: DC
Start: 2021-03-07 — End: 2021-08-12
  Filled 2021-03-07: qty 360, 90d supply, fill #0

## 2021-03-22 ENCOUNTER — Other Ambulatory Visit: Payer: Self-pay

## 2021-03-22 MED ORDER — MEDROXYPROGESTERONE ACETATE 10 MG PO TABS
ORAL_TABLET | ORAL | 0 refills | Status: DC
  Filled 2021-03-22: qty 10, 10d supply, fill #0

## 2021-05-12 IMAGING — CR DG THORACIC SPINE 3V
3 series · 3 of 3 positions shown · non-contrast
Comparison: None.

CLINICAL DATA: Neck pain and back pain x2 weeks.

EXAM:
THORACIC SPINE - 3 VIEWS

[t t-spine a.p.]
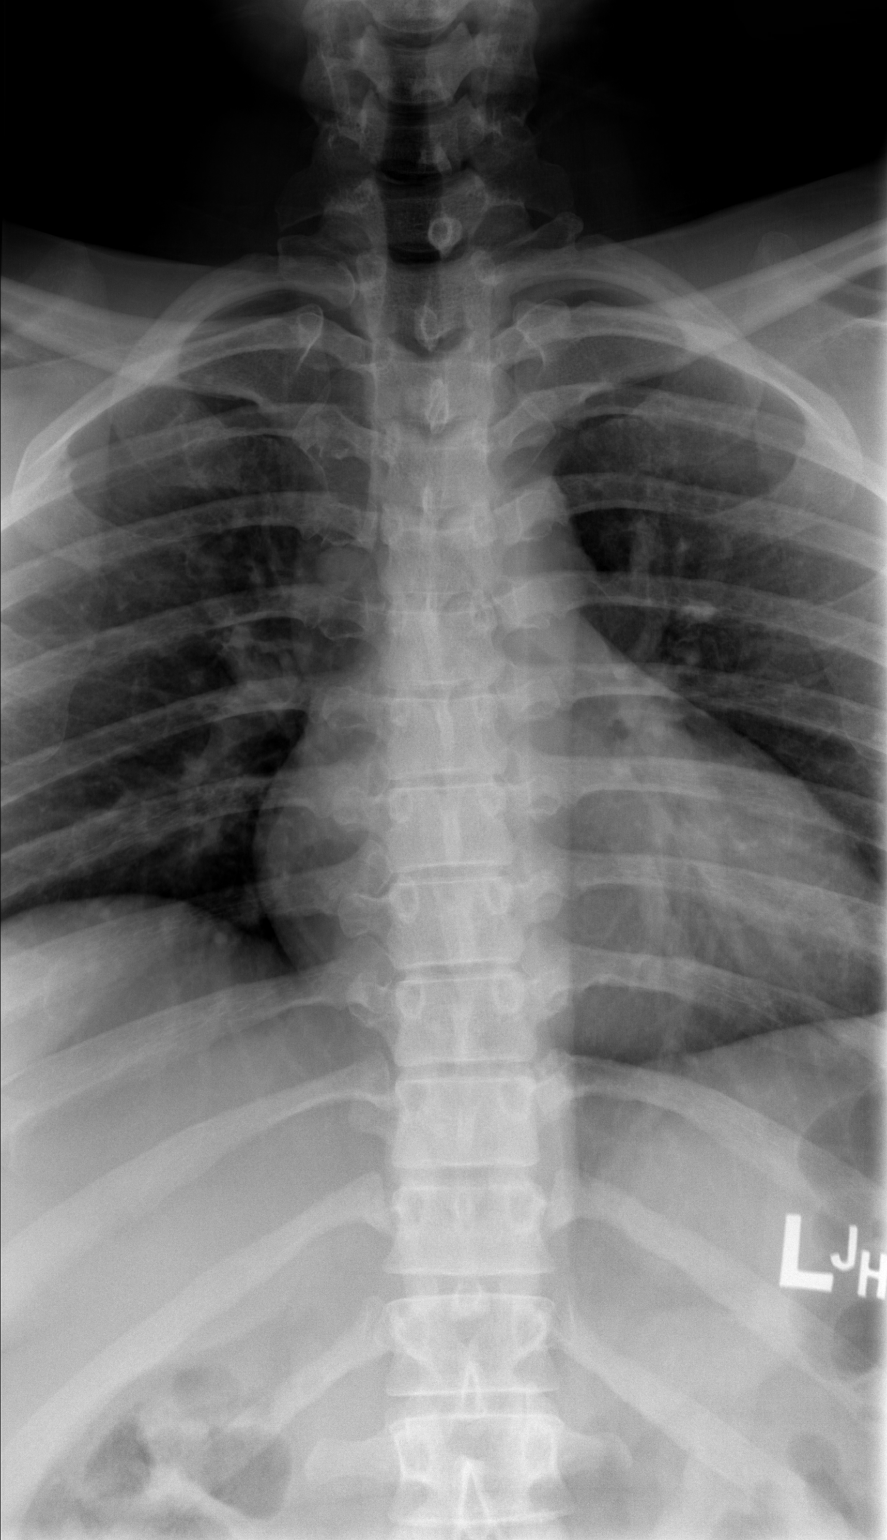

[t t-spine lat]
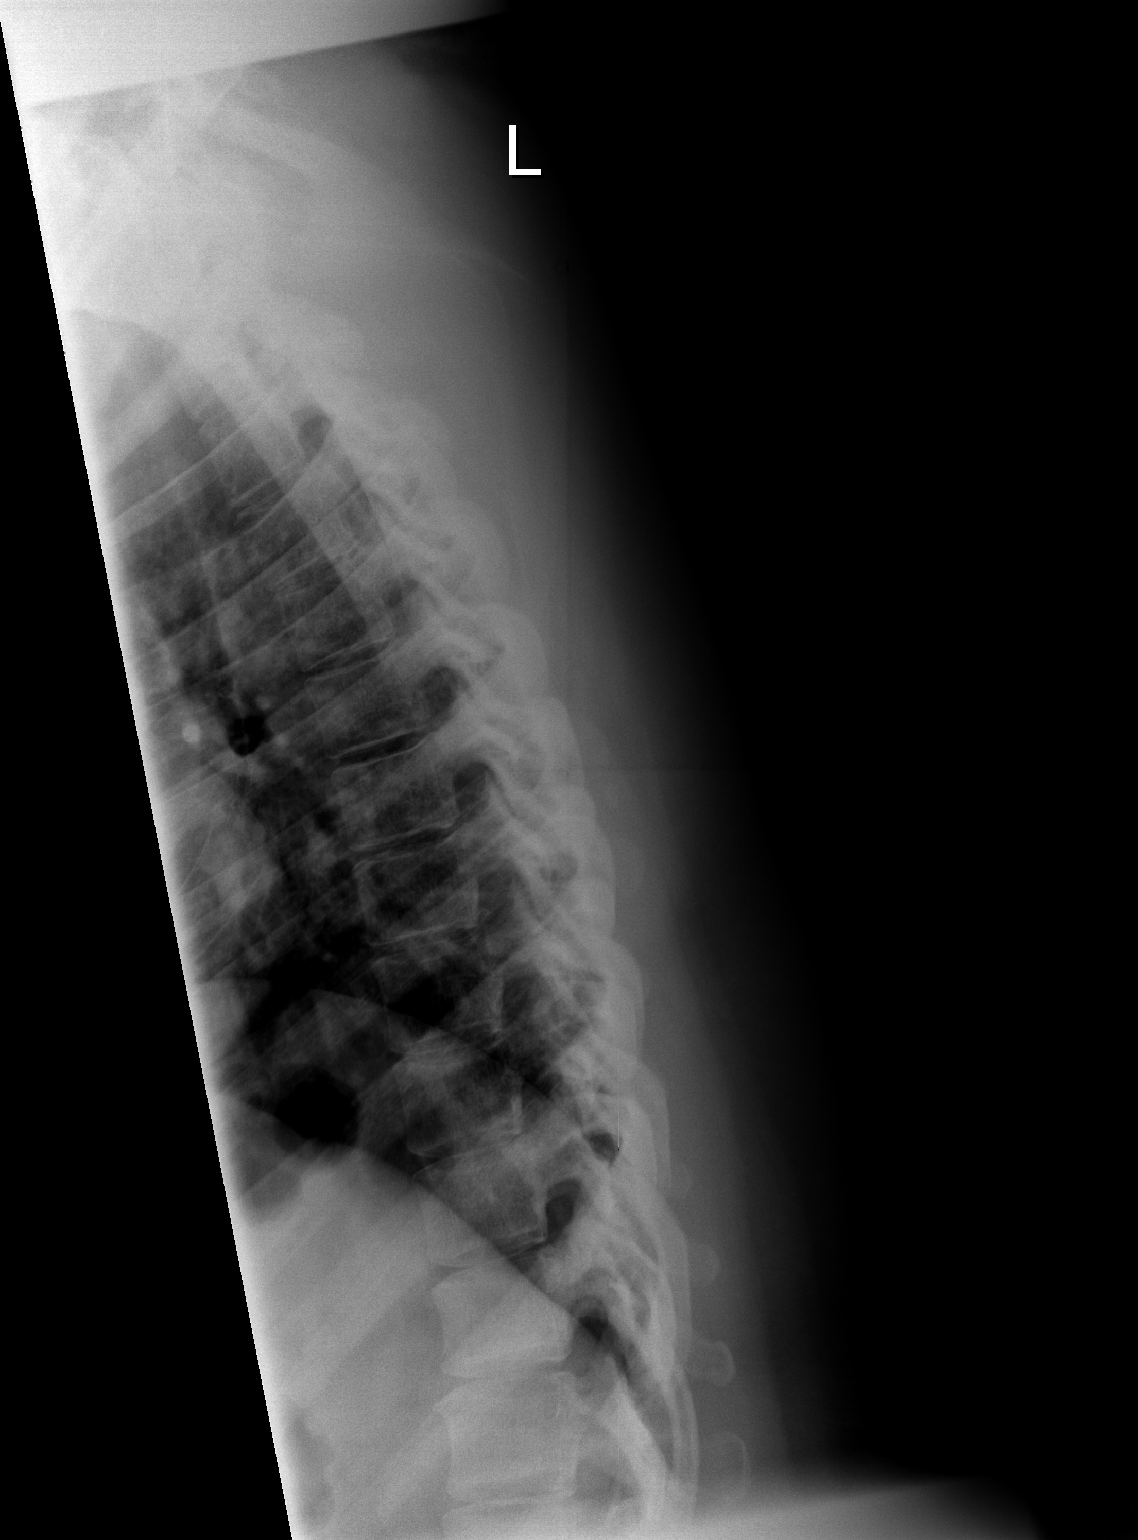

[t swimmers *]
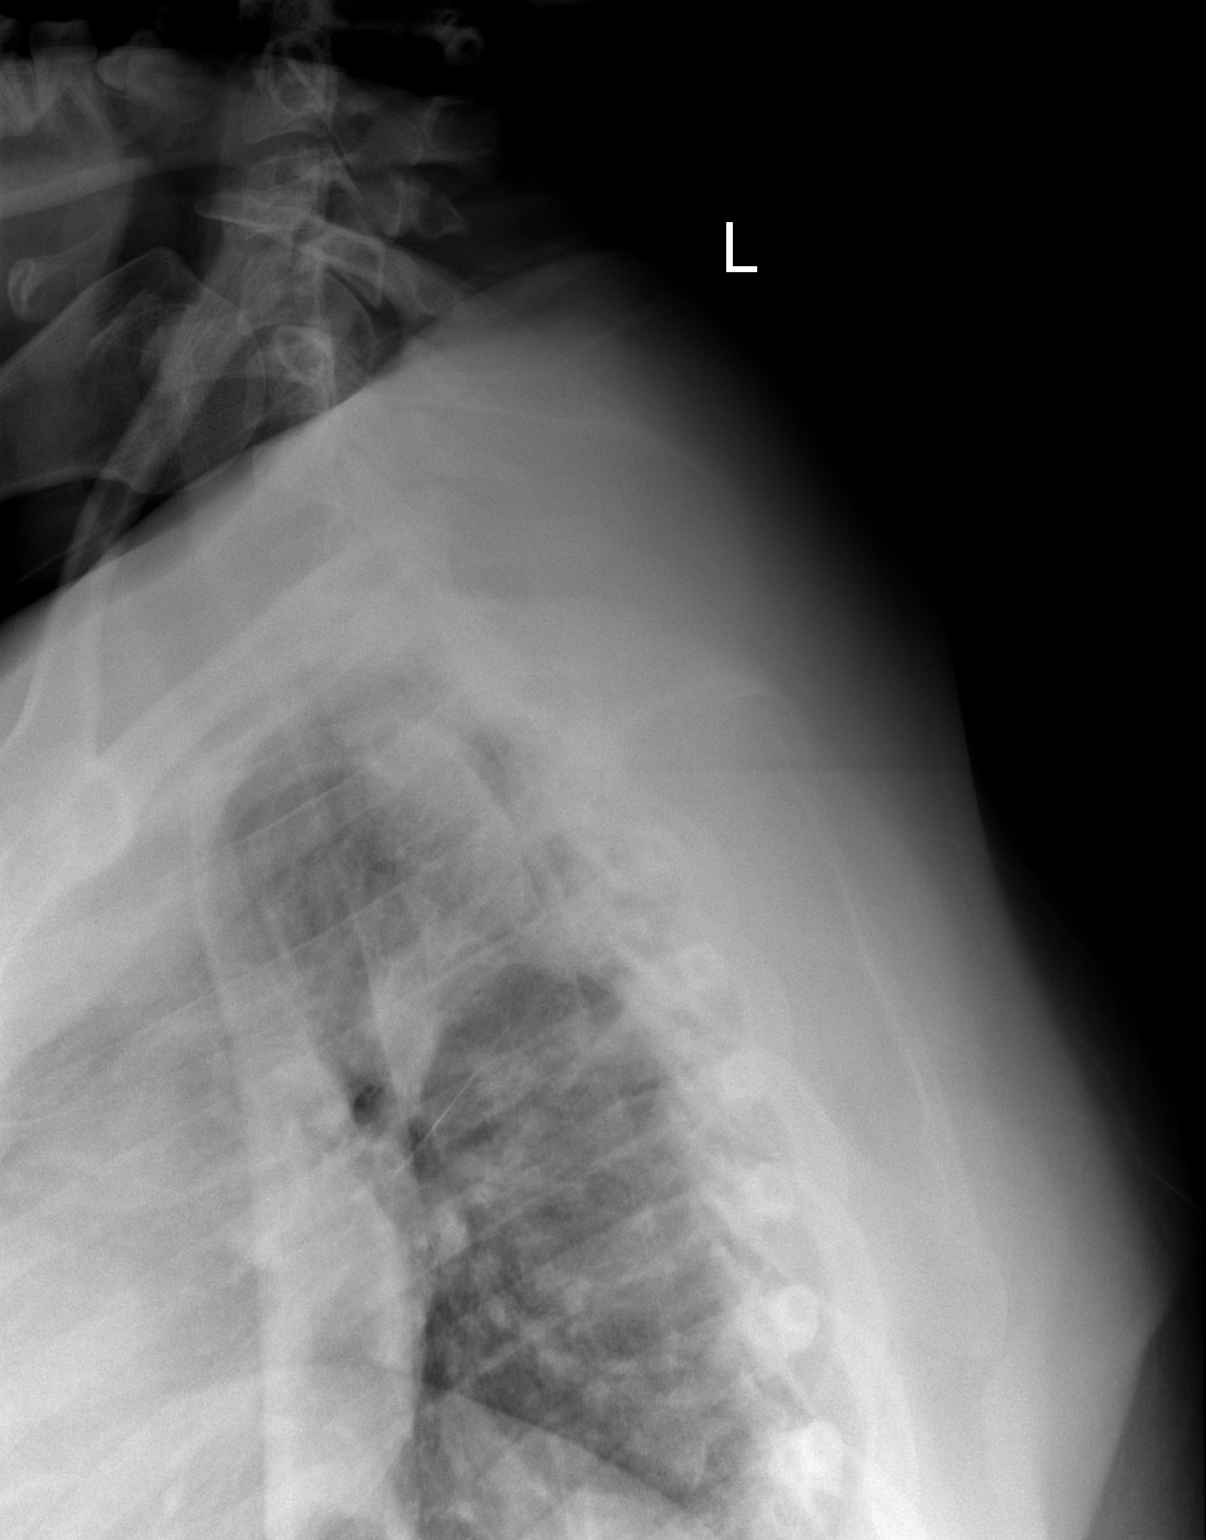

[3 of 3 positions shown; findings below may reference images not displayed]

FINDINGS: There is no evidence of thoracic spine fracture. Alignment is
normal. No other significant bone abnormalities are identified.
IMPRESSION: Negative.

## 2021-05-12 IMAGING — CR DG LUMBAR SPINE 2-3V
3 series · 3 of 3 positions shown · non-contrast
Comparison: None.

CLINICAL DATA: Neck pain and back pain x2 weeks.

EXAM:
LUMBAR SPINE - 2-3 VIEW

[t l-spine a.p.]
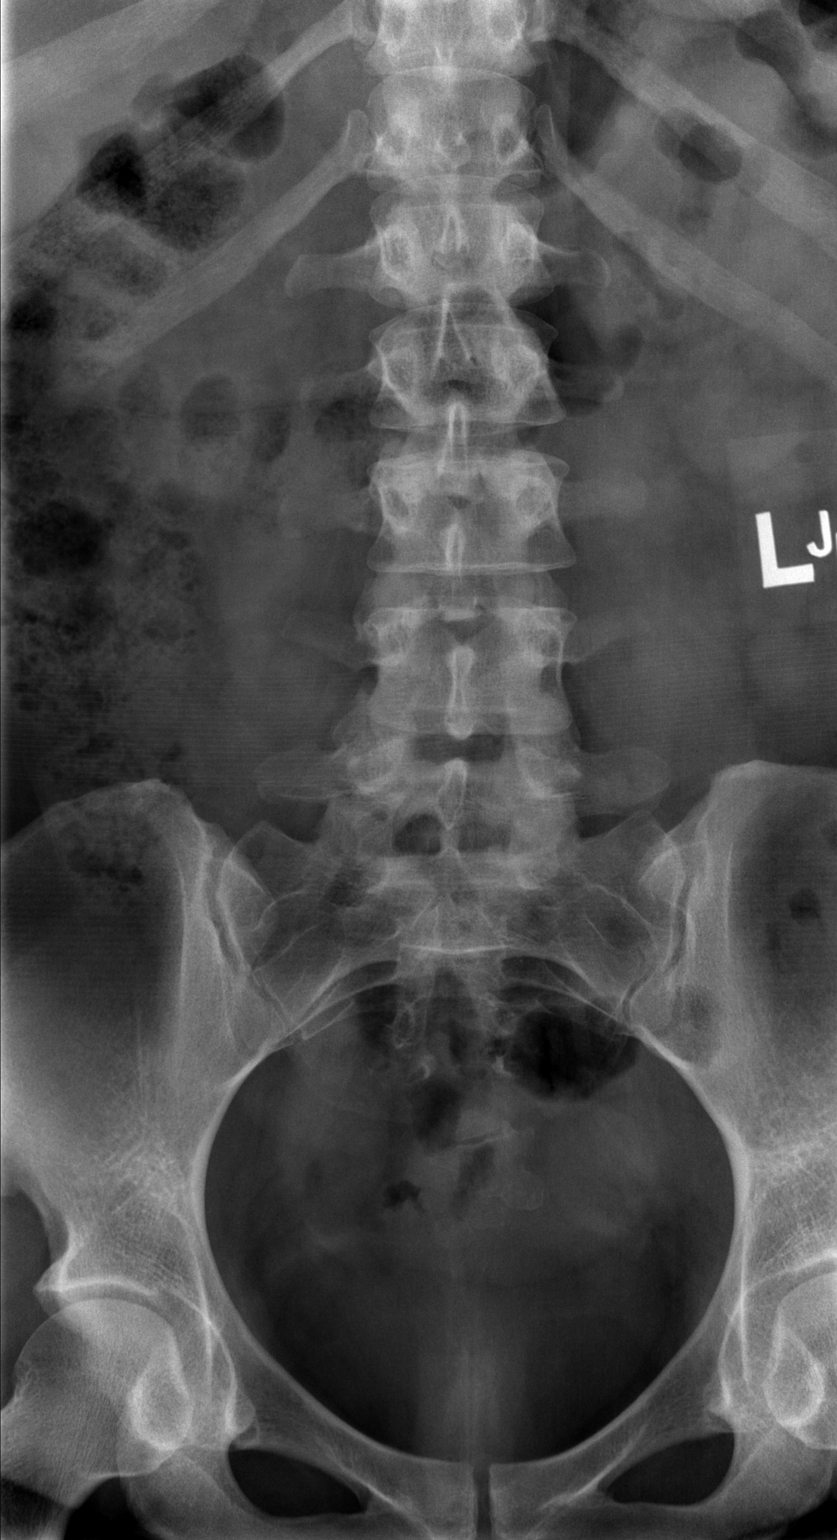

[t l-spine lat]
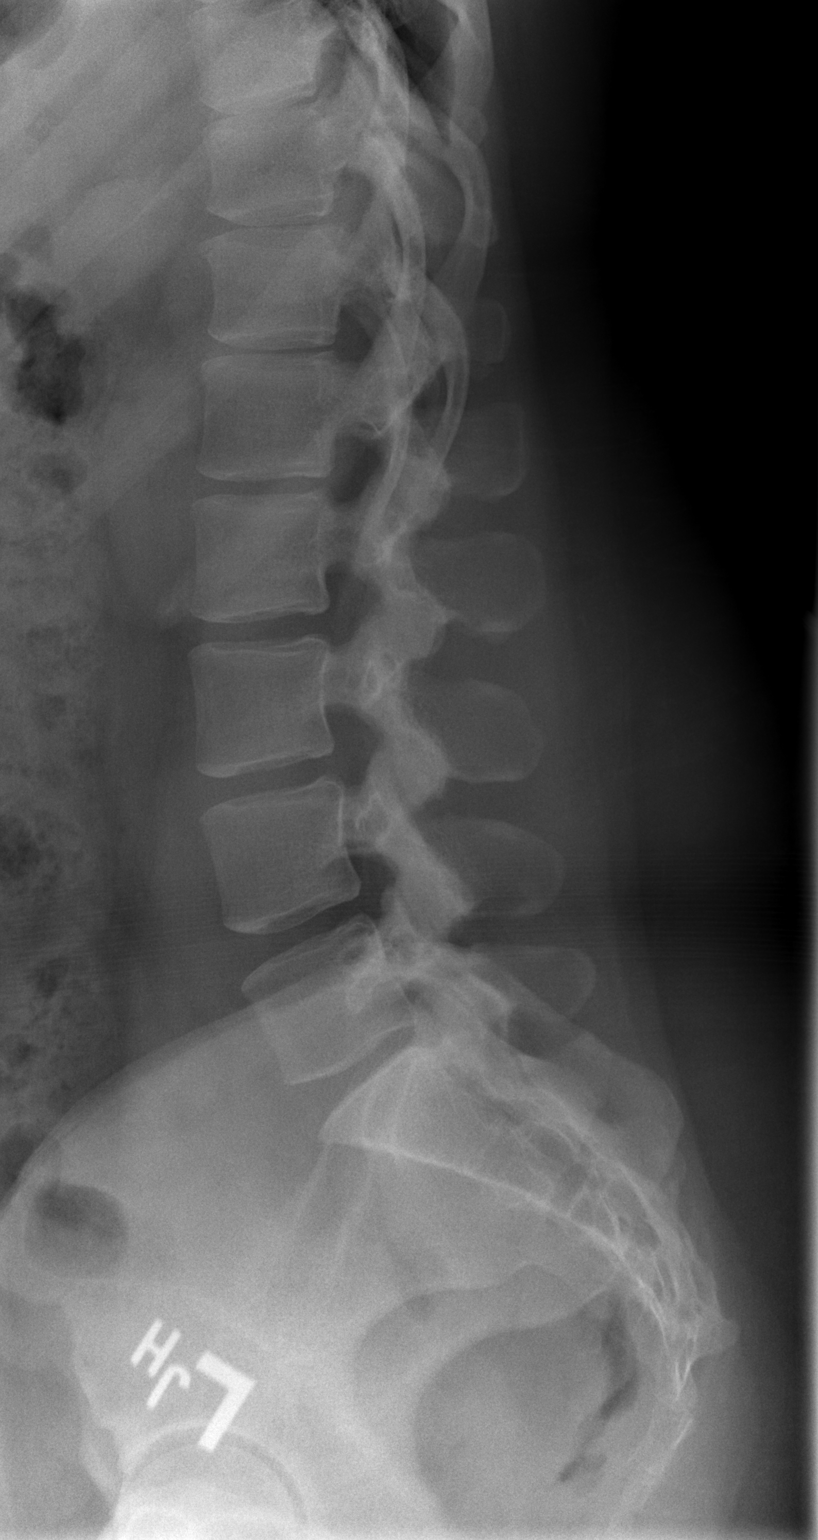

[t l-spine l5-s1 spot]
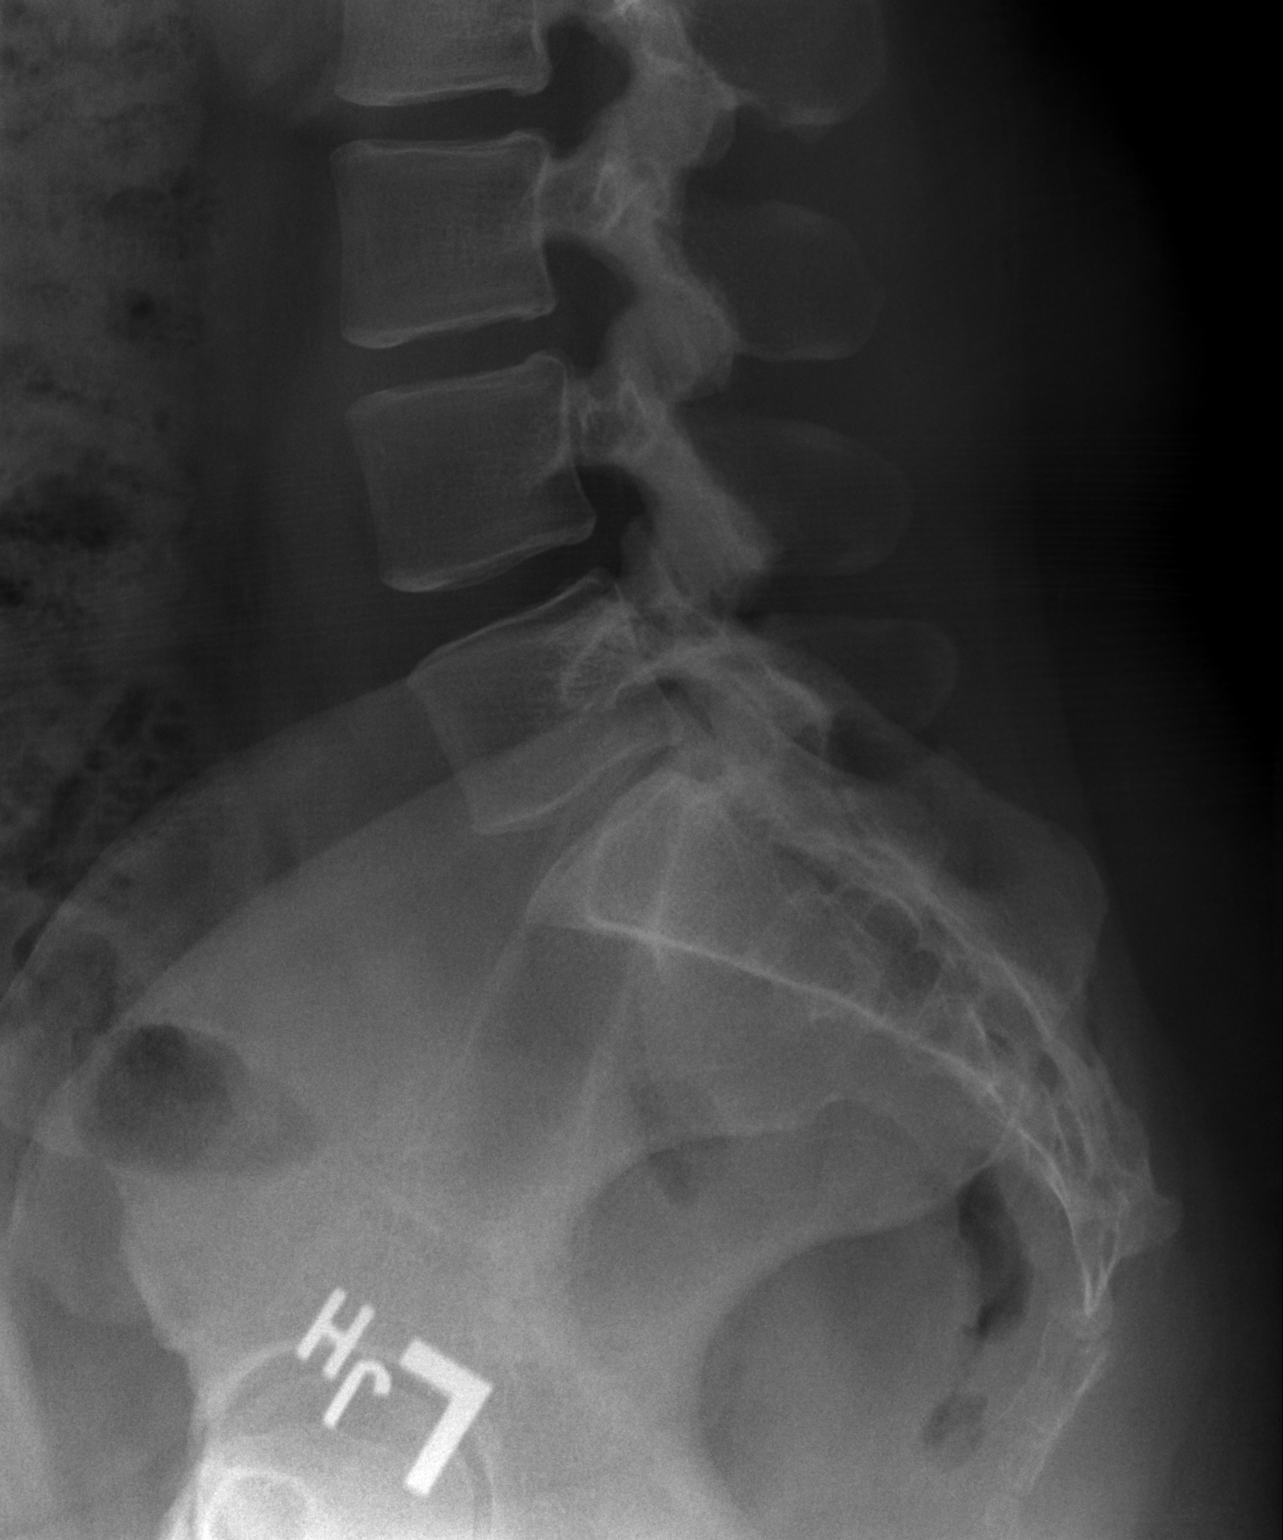

[3 of 3 positions shown; findings below may reference images not displayed]

FINDINGS: There is no evidence of lumbar spine fracture. Alignment is normal.
Intervertebral disc spaces are maintained.
IMPRESSION: Negative.

## 2021-05-12 IMAGING — CR DG CERVICAL SPINE 2 OR 3 VIEWS
3 series · 3 of 3 positions shown · non-contrast
Comparison: None.

CLINICAL DATA: Back and neck pain x2 weeks.

EXAM:
CERVICAL SPINE - 2-3 VIEW

[w c-spine lat *]
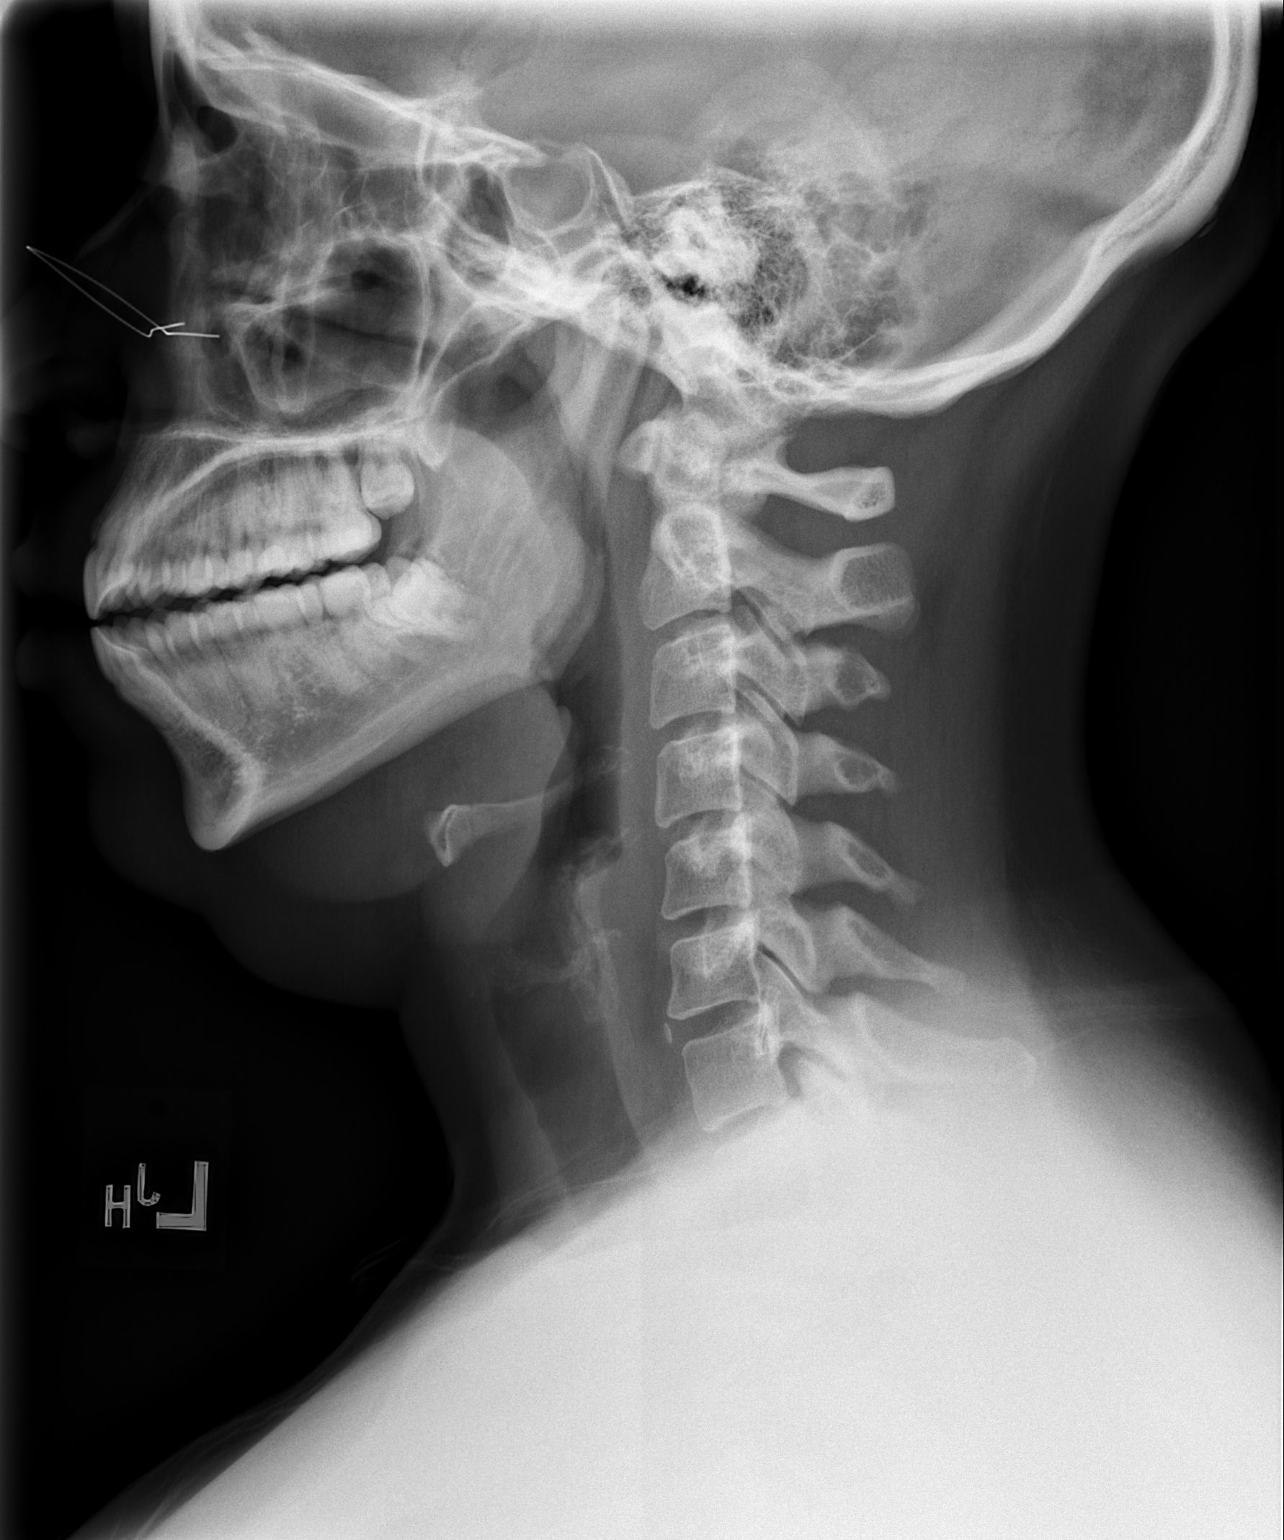

[w c-spine a.p. *]
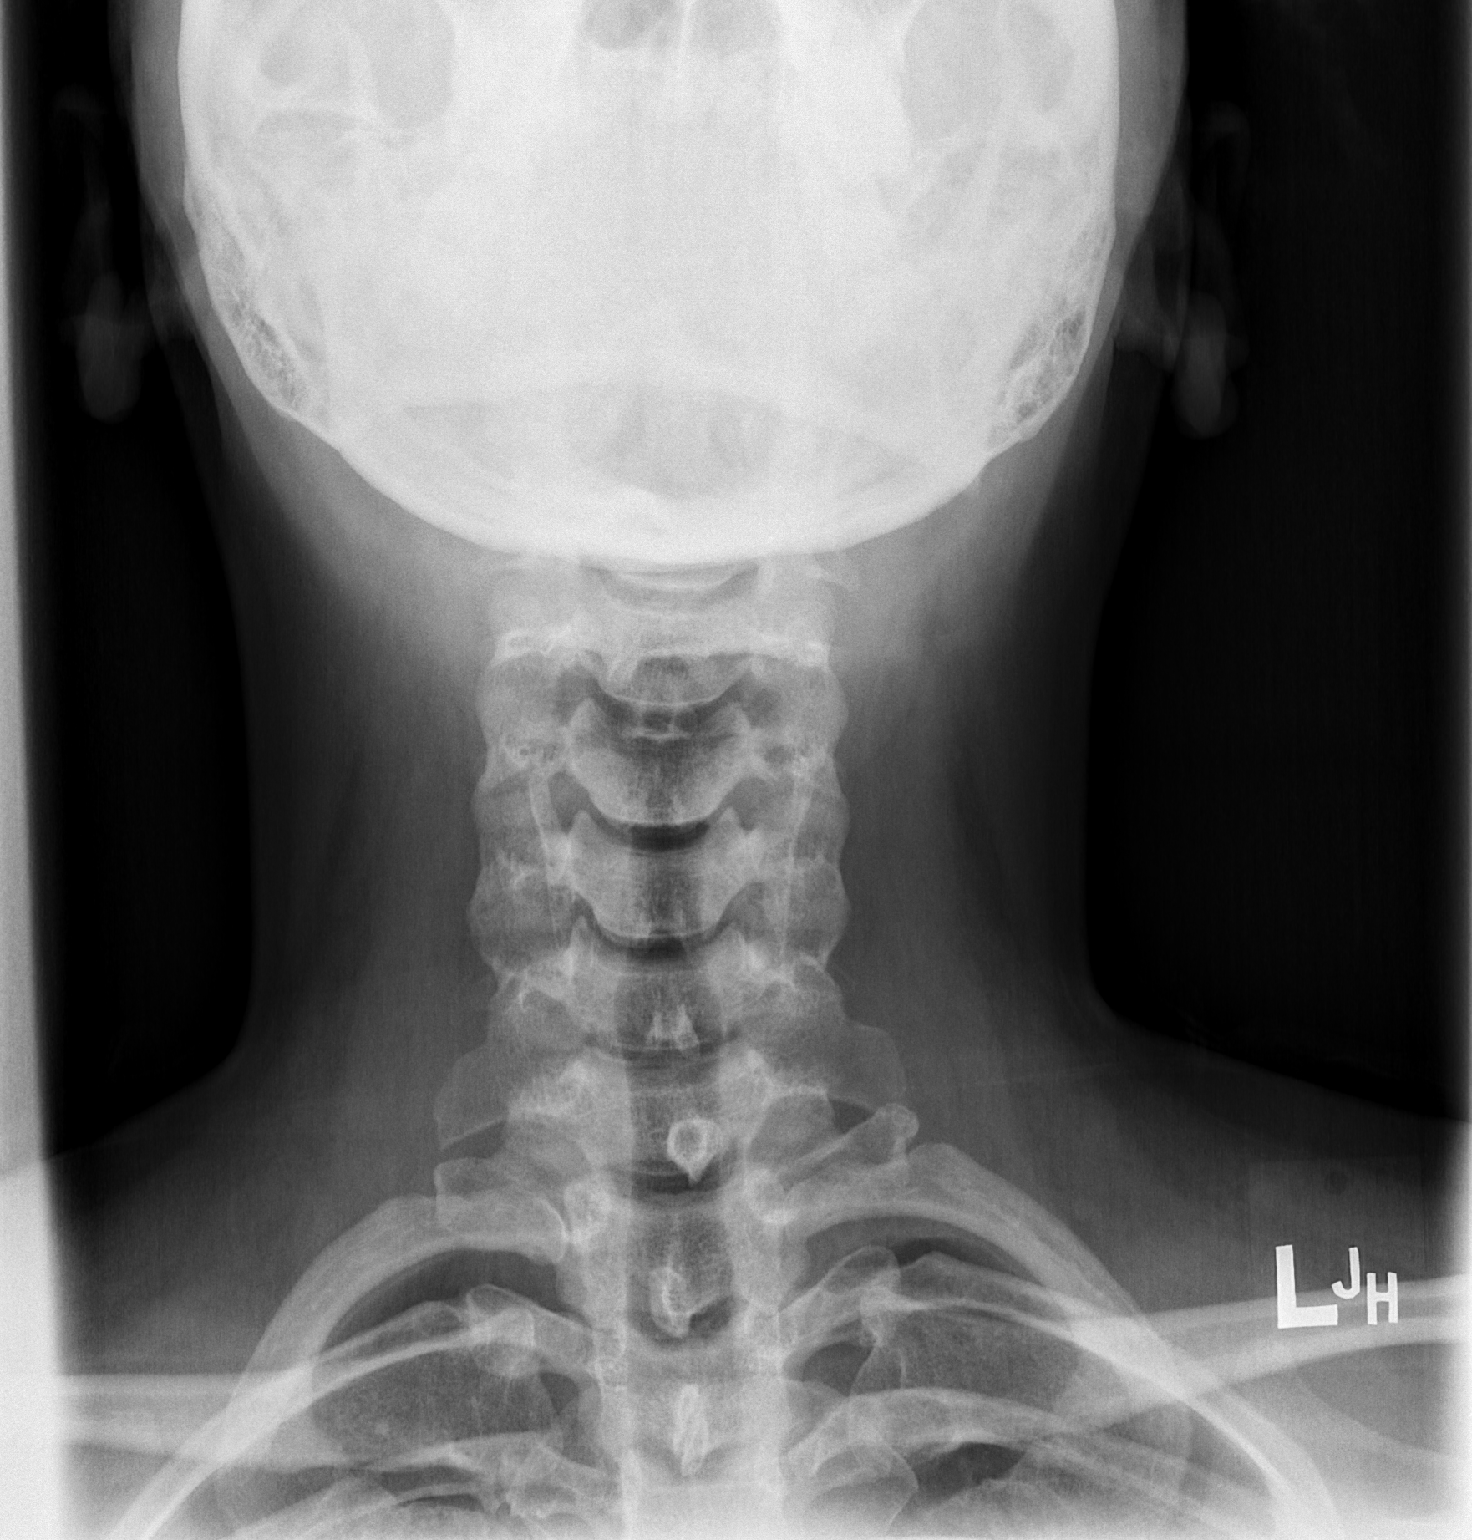

[w c-spine odontoid *]
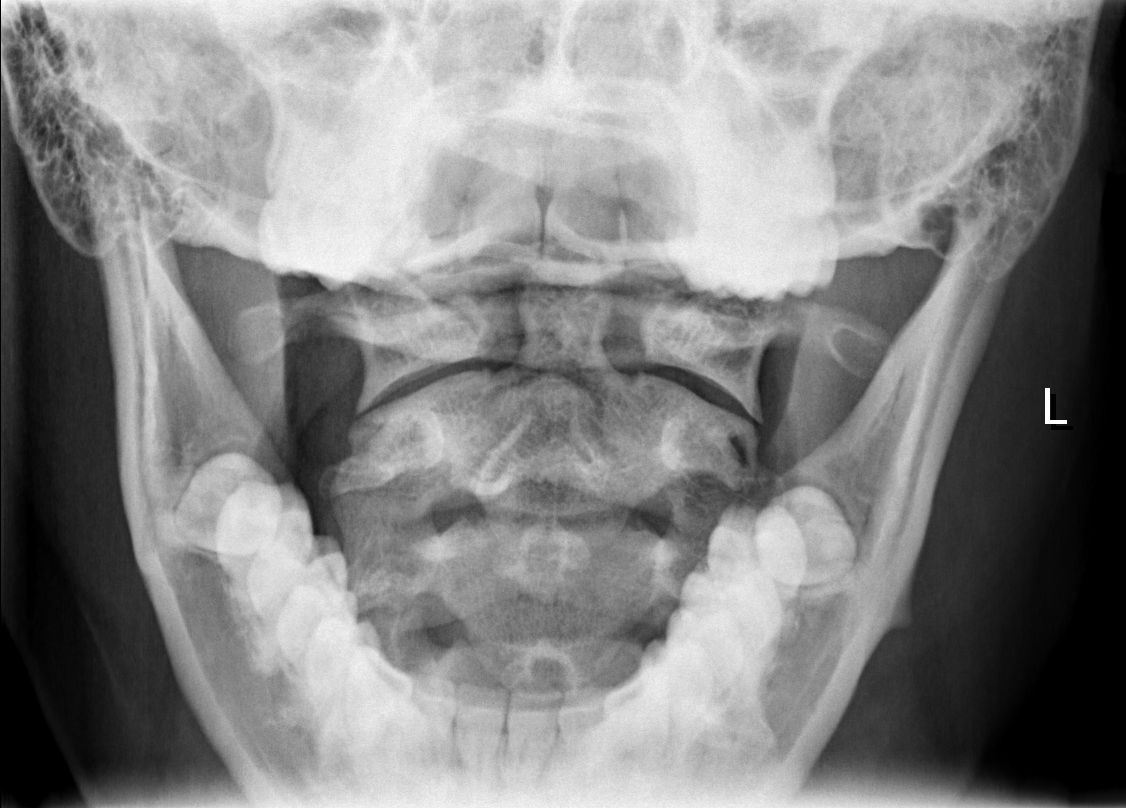

[3 of 3 positions shown; findings below may reference images not displayed]

FINDINGS: There is no evidence of cervical spine fracture or prevertebral soft
tissue swelling. Alignment is normal. No other significant bone
abnormalities are identified.
IMPRESSION: Negative cervical spine radiographs.

## 2021-05-24 ENCOUNTER — Other Ambulatory Visit: Payer: Self-pay

## 2021-06-23 ENCOUNTER — Other Ambulatory Visit: Payer: Self-pay

## 2021-06-23 MED ORDER — MEDROXYPROGESTERONE ACETATE 10 MG PO TABS
ORAL_TABLET | ORAL | 0 refills | Status: DC
  Filled 2021-06-23: qty 10, 10d supply, fill #0

## 2021-07-11 ENCOUNTER — Ambulatory Visit (INDEPENDENT_AMBULATORY_CARE_PROVIDER_SITE_OTHER): Payer: No Typology Code available for payment source | Admitting: Physician Assistant

## 2021-07-11 ENCOUNTER — Encounter: Payer: Self-pay | Admitting: Physician Assistant

## 2021-07-11 ENCOUNTER — Other Ambulatory Visit: Payer: Self-pay

## 2021-07-11 VITALS — BP 128/90 | HR 98 | Temp 98.0°F | Ht 64.0 in | Wt 258.5 lb

## 2021-07-11 DIAGNOSIS — L309 Dermatitis, unspecified: Secondary | ICD-10-CM | POA: Insufficient documentation

## 2021-07-11 DIAGNOSIS — K760 Fatty (change of) liver, not elsewhere classified: Secondary | ICD-10-CM | POA: Diagnosis not present

## 2021-07-11 DIAGNOSIS — E8881 Metabolic syndrome: Secondary | ICD-10-CM | POA: Diagnosis not present

## 2021-07-11 DIAGNOSIS — E282 Polycystic ovarian syndrome: Secondary | ICD-10-CM | POA: Diagnosis not present

## 2021-07-11 DIAGNOSIS — E669 Obesity, unspecified: Secondary | ICD-10-CM | POA: Diagnosis not present

## 2021-07-11 NOTE — Patient Instructions (Signed)
It was great to see you!  Continue to avoid juices and sodas  We will call you when we get [facility] sample in for you to start  Please follow-up with your ob-gyn about contraception  Let's follow-up in 4-6 weeks, sooner if you have concerns.  Take care,  Jarold Motto PA-C

## 2021-07-11 NOTE — Progress Notes (Signed)
Jordan Jackson is a 26 y.o. female here for a follow up referred by former PCP, Dr. Parke Simmers.  History of Present Illness:   Chief Complaint  Patient presents with   Establish Care   Obesity    Pt would like to discuss weight loss options due to having PCOS and having trouble losing weight.    HPI  PCOS She was diagnosed earlier last year. Currently Jordan Jackson is taking provera 10 mg daily as needed when she needs to get her period started, with no adverse effects.  Denies ongoing sx.   Insulin Resistance Pt is currently taking metformin XR 1000 mg twice daily but is non- compliant due to stomach sx. Pt states she usually takes this medication 3-4 times a week. She is interested in trialing a different medication at this time. Pt was initially started on this medication following blood work being completed by her gynecologist. At that time her hemoglobin A1c was 5.7%. Blood sugars at home are: not checked. Denies: hypoglycemic or hyperglycemic episodes or symptoms.   Obesity At this time, Jordan Jackson expresses her difficulty with losing weight which she believes is due to her PCOS. She admits that her diet isn't the best, but she has been trying to adjust her diet bit by bit. Pt reports that she is more motivated to lose weight due to wanting to work on conceiving a child with her husband. She is interested in trialing a medication to jump start her weight loss if possible and is aware that she will  Hx of Fatty Liver Pt has followed up with gastroenterology about this issue but nothing was ever done in relation to this issue. Denies ongoing sx. She is managing well.    Past Medical History:  Diagnosis Date   Fatty liver 10/24/2016   Refer to GI   Gastritis    easinophilic    GERD (gastroesophageal reflux disease)    Irregular periods/menstrual cycles 08/10/2015   Menstrual cramps 08/10/2015   Pelvic pain in female      Social History   Tobacco Use   Smoking status: Never    Smokeless tobacco: Never  Vaping Use   Vaping Use: Never used  Substance Use Topics   Alcohol use: Not Currently   Drug use: No    Past Surgical History:  Procedure Laterality Date   BIOPSY  12/26/2016   Procedure: BIOPSY;  Surgeon: West Bali, MD;  [facility];  Service: Endoscopy;;  gastric duodenal   COLONOSCOPY N/A 12/26/2016   Procedure: COLONOSCOPY;  Surgeon: West Bali, MD;  [facility];  Service: Endoscopy;  Laterality: N/A;  1:00pm   ESOPHAGOGASTRODUODENOSCOPY N/A 12/26/2016   Procedure: ESOPHAGOGASTRODUODENOSCOPY (EGD);  Surgeon: West Bali, MD;  [facility];  Service: Endoscopy;  Laterality: N/A;   NO PAST SURGERIES      Family History  Problem Relation Age of Onset   Arthritis Mother    High blood pressure Mother    Hypertension Father    Diabetes Maternal Grandmother    Hypertension Paternal Grandmother    Hypertension Paternal Grandfather    Diabetes Paternal Grandfather    Inflammatory bowel disease Neg Hx    Liver disease Neg Hx    Celiac disease Neg Hx    Colon cancer Neg Hx     No Known Allergies  Current Medications:   Current Outpatient Medications:    clindamycin (CLEOCIN T) 1 % external solution, Apply to affected areas in the  morning, Disp: 60 mL, Rfl: PRN   Fluocinolone Acetonide Scalp (DERMA-SMOOTHE/FS SCALP) 0.01 % OIL, apply to affected area at bedtime and wash off upon waking, Disp: 118.28 mL, Rfl: 3   medroxyPROGESTERone (PROVERA) 10 MG tablet, Take 1 tablet every day by oral route for 10 days., Disp: 10 tablet, Rfl: 0   metFORMIN (GLUCOPHAGE-XR) 500 MG 24 hr tablet, Take 2 tablets (1,000 mg total) by mouth 2 (two) times daily., Disp: 360 tablet, Rfl: 2   Review of Systems:   ROS Negative unless otherwise specified per HPI. Vitals:   Vitals:   07/11/21 1514  BP: 128/90  Pulse: 98  Temp: 98 F (36.7 C)  TempSrc: Temporal  SpO2: 96%  Weight: 258 lb 8 oz (117.3 kg)  Height: 5\' 4"   (1.626 m)     Body mass index is 44.37 kg/m.  Physical Exam:   Physical Exam Vitals and nursing note reviewed.  Constitutional:      General: She is not in acute distress.    Appearance: She is well-developed. She is not ill-appearing or toxic-appearing.  Cardiovascular:     Rate and Rhythm: Normal rate and regular rhythm.     Pulses: Normal pulses.     Heart sounds: Normal heart sounds, S1 normal and S2 normal.  Pulmonary:     Effort: Pulmonary effort is normal.     Breath sounds: Normal breath sounds.  Skin:    General: Skin is warm and dry.  Neurological:     Mental Status: She is alert.     GCS: GCS eye subscore is 4. GCS verbal subscore is 5. GCS motor subscore is 6.  Psychiatric:        Speech: Speech normal.        Behavior: Behavior normal. Behavior is cooperative.    Assessment and Plan:   PCOS (polycystic ovarian syndrome) Stable Mgmt per ob-gyn Follow up with ob-gyn for further discussion of contraception   Insulin resistance; Obesity, unspecified classification, unspecified obesity type, unspecified whether serious comorbidity present Will update labs today, adjust medication as indicated Stop use of metformin 1000 mg XR twice daily -- she is unable to tolerate this Will start Wegovy 2.5 mg weekly injection when sample available Encouraged patient to avoid juice and sodas, as well as make healthier dietary choices and participate in daily exercise Discussed that this medication is not safe for pregnancy and she should discontinue this if there are efforts to try to conceive -- she verbalized understanding Follow up in 4-6 weeks, sooner if concerns occur   Hx of Fatty Liver Continue to work on healthy diet and exercise as able GLP-1 will also be helpful for this.   I,Jordan Jackson,acting as a for Neurosurgeon, PA.,have documented all relevant documentation on the behalf of [employer], PA,as directed by  Jarold Motto, PA while in the  presence of Jarold Motto, Jarold Motto.  I, [place], Jarold Motto, have reviewed all documentation for this visit. The documentation on 07/11/21 for the exam, diagnosis, procedures, and orders are all accurate and complete.  07/13/21, PA-C

## 2021-07-12 LAB — CBC WITH DIFFERENTIAL/PLATELET
Basophils Absolute: 0.1 10*3/uL (ref 0.0–0.1)
Basophils Relative: 0.7 % (ref 0.0–3.0)
Eosinophils Absolute: 0.3 10*3/uL (ref 0.0–0.7)
Eosinophils Relative: 3.4 % (ref 0.0–5.0)
HCT: 41.5 % (ref 36.0–46.0)
Hemoglobin: 13.6 g/dL (ref 12.0–15.0)
Lymphocytes Relative: 30.8 % (ref 12.0–46.0)
Lymphs Abs: 2.3 10*3/uL (ref 0.7–4.0)
MCHC: 32.9 g/dL (ref 30.0–36.0)
MCV: 87.3 fl (ref 78.0–100.0)
Monocytes Absolute: 0.7 10*3/uL (ref 0.1–1.0)
Monocytes Relative: 9.7 % (ref 3.0–12.0)
Neutro Abs: 4.1 10*3/uL (ref 1.4–7.7)
Neutrophils Relative %: 55.4 % (ref 43.0–77.0)
Platelets: 399 10*3/uL (ref 150.0–400.0)
RBC: 4.76 Mil/uL (ref 3.87–5.11)
RDW: 13.5 % (ref 11.5–15.5)
WBC: 7.4 10*3/uL (ref 4.0–10.5)

## 2021-07-12 LAB — COMPREHENSIVE METABOLIC PANEL
ALT: 42 U/L — ABNORMAL HIGH (ref 0–35)
AST: 27 U/L (ref 0–37)
Albumin: 4.6 g/dL (ref 3.5–5.2)
Alkaline Phosphatase: 81 U/L (ref 39–117)
BUN: 11 mg/dL (ref 6–23)
CO2: 24 mEq/L (ref 19–32)
Calcium: 10 mg/dL (ref 8.4–10.5)
Chloride: 103 mEq/L (ref 96–112)
Creatinine, Ser: 0.66 mg/dL (ref 0.40–1.20)
GFR: 120.82 mL/min (ref >60.00)
Glucose, Bld: 79 mg/dL (ref 70–99)
Potassium: 4.1 mEq/L (ref 3.5–5.1)
Sodium: 139 mEq/L (ref 135–145)
Total Bilirubin: 0.6 mg/dL (ref 0.2–1.2)
Total Protein: 8.4 g/dL — ABNORMAL HIGH (ref 6.0–8.3)

## 2021-07-12 LAB — HEMOGLOBIN A1C: Hgb A1c MFr Bld: 5.8 % (ref 4.6–6.5)

## 2021-07-12 LAB — TSH: TSH: 2.54 u[IU]/mL (ref 0.35–5.50)

## 2021-07-13 ENCOUNTER — Telehealth: Payer: Self-pay | Admitting: *Deleted

## 2021-07-13 ENCOUNTER — Other Ambulatory Visit: Payer: Self-pay

## 2021-07-13 ENCOUNTER — Other Ambulatory Visit: Payer: Self-pay | Admitting: Physician Assistant

## 2021-07-13 DIAGNOSIS — R7989 Other specified abnormal findings of blood chemistry: Secondary | ICD-10-CM

## 2021-07-13 MED ORDER — WEGOVY 0.5 MG/0.5ML ~~LOC~~ SOAJ
0.5000 mg | SUBCUTANEOUS | 0 refills | Status: DC
  Filled 2021-07-13 (×2): qty 2, 28d supply, fill #0

## 2021-07-13 NOTE — Telephone Encounter (Signed)
(  Key: [id number]) Rx #: [id number] YQMGNO 0.5MG /0.5ML auto-injector Waiting for determination

## 2021-07-13 NOTE — Telephone Encounter (Signed)
The request has been approved. The authorization is effective for a maximum of 3 fills from 07/13/2021 to 10/10/2021 Pharmacy notified

## 2021-07-14 ENCOUNTER — Other Ambulatory Visit: Payer: Self-pay

## 2021-07-14 MED ORDER — LO LOESTRIN FE 1 MG-10 MCG / 10 MCG PO TABS
1.0000 | ORAL_TABLET | Freq: Every day | ORAL | 3 refills | Status: DC
  Filled 2021-07-14: qty 84, 84d supply, fill #0

## 2021-07-15 MED ORDER — WEGOVY 0.25 MG/0.5ML ~~LOC~~ SOAJ: 0.2500 mg | SUBCUTANEOUS | 0 refills | Status: DC

## 2021-07-15 NOTE — Addendum Note (Signed)
Addended by: Jimmye Norman on: 07/15/2021 02:22 PM   Modules accepted: Orders

## 2021-07-15 NOTE — Telephone Encounter (Signed)
Pt came by and picked up sample of Wegovy 0.25 mg.   Called pt and told her Wegovy 0.5 mg has been approved by insurance. Pt verbalized understanding. Asked her if the pharmacy called her? Pt said no but she received a My Chart message. Told her approved only till April when you are on you 2nd to last shot let me know so I can send in next dose for you. Pt verbalized understanding.

## 2021-07-19 ENCOUNTER — Other Ambulatory Visit: Payer: Self-pay

## 2021-07-20 ENCOUNTER — Other Ambulatory Visit: Payer: Self-pay

## 2021-07-20 MED ORDER — DESOGESTREL-ETHINYL ESTRADIOL 0.15-30 MG-MCG PO TABS
ORAL_TABLET | ORAL | 4 refills | Status: DC
  Filled 2021-07-20: qty 84, 84d supply, fill #0
  Filled 2021-09-24: qty 84, 84d supply, fill #1

## 2021-07-21 ENCOUNTER — Other Ambulatory Visit: Payer: Self-pay

## 2021-07-25 ENCOUNTER — Other Ambulatory Visit (INDEPENDENT_AMBULATORY_CARE_PROVIDER_SITE_OTHER): Payer: No Typology Code available for payment source

## 2021-07-25 ENCOUNTER — Other Ambulatory Visit: Payer: Self-pay

## 2021-07-25 DIAGNOSIS — R7989 Other specified abnormal findings of blood chemistry: Secondary | ICD-10-CM

## 2021-07-25 LAB — COMPREHENSIVE METABOLIC PANEL
ALT: 34 U/L (ref 0–35)
AST: 23 U/L (ref 0–37)
Albumin: 4.4 g/dL (ref 3.5–5.2)
Alkaline Phosphatase: 86 U/L (ref 39–117)
BUN: 13 mg/dL (ref 6–23)
CO2: 25 mEq/L (ref 19–32)
Calcium: 9.6 mg/dL (ref 8.4–10.5)
Chloride: 106 mEq/L (ref 96–112)
Creatinine, Ser: 0.69 mg/dL (ref 0.40–1.20)
GFR: 119.5 mL/min (ref >60.00)
Glucose, Bld: 94 mg/dL (ref 70–99)
Potassium: 4.2 mEq/L (ref 3.5–5.1)
Sodium: 139 mEq/L (ref 135–145)
Total Bilirubin: 0.4 mg/dL (ref 0.2–1.2)
Total Protein: 8 g/dL (ref 6.0–8.3)

## 2021-07-29 ENCOUNTER — Encounter: Payer: Self-pay | Admitting: Physician Assistant

## 2021-08-12 ENCOUNTER — Encounter: Payer: Self-pay | Admitting: Physician Assistant

## 2021-08-12 ENCOUNTER — Other Ambulatory Visit: Payer: Self-pay

## 2021-08-12 ENCOUNTER — Ambulatory Visit (INDEPENDENT_AMBULATORY_CARE_PROVIDER_SITE_OTHER): Payer: No Typology Code available for payment source | Admitting: Physician Assistant

## 2021-08-12 VITALS — BP 110/78 | HR 90 | Temp 97.9°F | Ht 64.0 in | Wt 248.0 lb

## 2021-08-12 DIAGNOSIS — E669 Obesity, unspecified: Secondary | ICD-10-CM

## 2021-08-12 NOTE — Patient Instructions (Signed)
It was great to see you!  Continue to increase your Wegovy as tolerated.  Let's follow-up in 3 months, sooner if you have concerns.  Take care,  Jarold Motto PA-C

## 2021-08-12 NOTE — Progress Notes (Signed)
Jordan Jackson is a 26 y.o. female here for a follow up of obesity.  History of Present Illness:   Chief Complaint  Patient presents with   Obesity    Pt is doing well on Wegovy, finished 0.25 mg and will start 0.5 mg on Sunday.    HPI  Obesity Marea is currently compliant with taking Wegovy 0.25 mg weekly injection with no complications. Since starting the medication, pt has lost 10 pounds and is finding the medication to be beneficial in regards to appetite suppression. Reports she is looking forward to increasing her dosage to 0.5 mg weekly on this Sunday. Pt also shares she finds the medication to be affordable and is managing well. Denies concerning sx.    Past Medical History:  Diagnosis Date   Fatty liver 10/24/2016   Refer to GI   Gastritis    easinophilic    GERD (gastroesophageal reflux disease)    Irregular periods/menstrual cycles 08/10/2015   Menstrual cramps 08/10/2015   Pelvic pain in female      Social History   Tobacco Use   Smoking status: Never   Smokeless tobacco: Never  Vaping Use   Vaping Use: Never used  Substance Use Topics   Alcohol use: Not Currently   Drug use: No    Past Surgical History:  Procedure Laterality Date   BIOPSY  12/26/2016   Procedure: BIOPSY;  Surgeon: West Bali, MD;  [facility];  Service: Endoscopy;;  gastric duodenal   COLONOSCOPY N/A 12/26/2016   Procedure: COLONOSCOPY;  Surgeon: West Bali, MD;  [facility];  Service: Endoscopy;  Laterality: N/A;  1:00pm   ESOPHAGOGASTRODUODENOSCOPY N/A 12/26/2016   Procedure: ESOPHAGOGASTRODUODENOSCOPY (EGD);  Surgeon: West Bali, MD;  [facility];  Service: Endoscopy;  Laterality: N/A;   NO PAST SURGERIES      Family History  Problem Relation Age of Onset   Arthritis Mother    High blood pressure Mother    Hypertension Father    Diabetes Maternal Grandmother    Hypertension Paternal Grandmother    Hypertension Paternal  Grandfather    Diabetes Paternal Grandfather    Inflammatory bowel disease Neg Hx    Liver disease Neg Hx    Celiac disease Neg Hx    Colon cancer Neg Hx     No Known Allergies  Current Medications:   Current Outpatient Medications:    clindamycin (CLEOCIN T) 1 % external solution, Apply to affected areas in the morning, Disp: 60 mL, Rfl: PRN   desogestrel-ethinyl estradiol (APRI) 0.15-30 MG-MCG tablet, Take 1 tablet every day by oral route., Disp: 84 tablet, Rfl: 4   Fluocinolone Acetonide Scalp (DERMA-SMOOTHE/FS SCALP) 0.01 % OIL, apply to affected area at bedtime and wash off upon waking, Disp: 118.28 mL, Rfl: 3   Semaglutide-Weight Management (WEGOVY) 0.5 MG/0.5ML SOAJ, Inject 0.5 mg into the skin once a week., Disp: 2 mL, Rfl: 0   Review of Systems:   ROS Negative unless otherwise specified per HPI.  Vitals:   Vitals:   08/12/21 1438  BP: 110/78  Pulse: 90  Temp: 97.9 F (36.6 C)  TempSrc: Temporal  SpO2: 97%  Weight: 248 lb (112.5 kg)  Height: 5\' 4"  (1.626 m)     Body mass index is 42.57 kg/m.  Physical Exam:   Physical Exam Vitals and nursing note reviewed.  Constitutional:      General: She is not in acute distress.    Appearance: She  is well-developed. She is not ill-appearing or toxic-appearing.  Cardiovascular:     Rate and Rhythm: Normal rate and regular rhythm.     Pulses: Normal pulses.     Heart sounds: Normal heart sounds, S1 normal and S2 normal.  Pulmonary:     Effort: Pulmonary effort is normal.     Breath sounds: Normal breath sounds.  Skin:    General: Skin is warm and dry.  Neurological:     Mental Status: She is alert.     GCS: GCS eye subscore is 4. GCS verbal subscore is 5. GCS motor subscore is 6.  Psychiatric:        Speech: Speech normal.        Behavior: Behavior normal. Behavior is cooperative.    Assessment and Plan:   Obesity, unspecified classification, unspecified obesity type, unspecified whether serious comorbidity  present Improving Will increase her Wegovy to 0.5 mg weekly injection Encouraged patient to continue participating in healthy eating and regular exercise  Informed patient to reach out in 2-4 weeks if increased dosage is still desired or not  Follow up in office in 3 months, sooner if concerns  I,Havlyn C Ratchford,acting as a scribe for [employer], PA.,have documented all relevant documentation on the behalf of Jarold Motto, PA,as directed by  Jarold Motto, PA while in the presence of Jarold Motto, [place].  I, Jarold Motto, [place], have reviewed all documentation for this visit. The documentation on 08/12/21 for the exam, diagnosis, procedures, and orders are all accurate and complete.   Jarold Motto, PA-C

## 2021-08-24 ENCOUNTER — Encounter: Payer: Self-pay | Admitting: Physician Assistant

## 2021-08-25 ENCOUNTER — Other Ambulatory Visit: Payer: Self-pay

## 2021-08-25 ENCOUNTER — Other Ambulatory Visit: Payer: Self-pay | Admitting: *Deleted

## 2021-08-25 MED ORDER — WEGOVY 1 MG/0.5ML ~~LOC~~ SOAJ
1.0000 mg | SUBCUTANEOUS | 0 refills | Status: DC
  Filled 2021-08-25: qty 2, 28d supply, fill #0

## 2021-08-25 MED ORDER — WEGOVY 0.5 MG/0.5ML ~~LOC~~ SOAJ
0.5000 mg | SUBCUTANEOUS | 0 refills | Status: DC
  Filled 2021-08-25: qty 2, 28d supply, fill #0

## 2021-09-20 ENCOUNTER — Other Ambulatory Visit: Payer: Self-pay

## 2021-09-20 MED ORDER — WEGOVY 1 MG/0.5ML ~~LOC~~ SOAJ
1.0000 mg | SUBCUTANEOUS | 0 refills | Status: DC
  Filled 2021-09-20: qty 2, 28d supply, fill #0

## 2021-09-20 MED ORDER — WEGOVY 1.7 MG/0.75ML ~~LOC~~ SOAJ
1.7000 mg | SUBCUTANEOUS | 3 refills | Status: DC
  Filled 2021-09-20 – 2021-09-30 (×3): qty 3, 28d supply, fill #0

## 2021-09-26 ENCOUNTER — Other Ambulatory Visit: Payer: Self-pay

## 2021-09-27 ENCOUNTER — Other Ambulatory Visit: Payer: Self-pay

## 2021-09-30 ENCOUNTER — Other Ambulatory Visit: Payer: Self-pay

## 2021-10-04 ENCOUNTER — Other Ambulatory Visit: Payer: Self-pay

## 2021-10-14 ENCOUNTER — Other Ambulatory Visit: Payer: Self-pay

## 2021-10-14 ENCOUNTER — Ambulatory Visit (INDEPENDENT_AMBULATORY_CARE_PROVIDER_SITE_OTHER): Payer: No Typology Code available for payment source | Admitting: Physician Assistant

## 2021-10-14 ENCOUNTER — Encounter: Payer: Self-pay | Admitting: Physician Assistant

## 2021-10-14 VITALS — BP 124/88 | HR 91 | Temp 98.2°F | Ht 64.0 in | Wt 229.0 lb

## 2021-10-14 DIAGNOSIS — M25512 Pain in left shoulder: Secondary | ICD-10-CM | POA: Diagnosis not present

## 2021-10-14 DIAGNOSIS — K2100 Gastro-esophageal reflux disease with esophagitis, without bleeding: Secondary | ICD-10-CM | POA: Diagnosis not present

## 2021-10-14 MED ORDER — METHYLPREDNISOLONE 4 MG PO TBPK
ORAL_TABLET | ORAL | 0 refills | Status: DC
  Filled 2021-10-14: qty 21, 6d supply, fill #0

## 2021-10-14 MED ORDER — PANTOPRAZOLE SODIUM 40 MG PO TBEC
40.0000 mg | DELAYED_RELEASE_TABLET | Freq: Every day | ORAL | 0 refills | Status: DC
  Filled 2021-10-14: qty 30, 30d supply, fill #0

## 2021-10-14 NOTE — Patient Instructions (Signed)
Very good to meet you today. ?I think that the increased use of ibuprofen as well as the recent increase in [facility] has flared up your gastritis. ?Please take the Protonix 40 mg once daily prior to meals for at least the next 30 days. ?Also start on Medrol Dosepak to help alleviate both symptoms of the gastritis and left shoulder pain. ?You can use Voltaren gel massage into the shoulder at least 3 times daily as well as ice and stretches. ? ?Please recheck if symptoms do not improve or change in any way. ? ?You can consider physical therapy for the shoulder area if you do not see a significant difference. ?

## 2021-10-14 NOTE — Progress Notes (Signed)
? ?Subjective:  ? ? Patient ID: Jordan Jackson, female    DOB: January 24, 1996, 26 y.o.   MRN: [id number] ? ?Chief Complaint  ?Patient presents with  ? Back Pain  ?  Pt c/o upper back pain at shoulder blade area and gong up into neck., x 2-3 months but is worsening. Ibuprofen 400 mg with some relief.  ? Abdominal Pain  ?  Pt c/o epigastric pain and nausea when she wakes up in the morning for the past 2 weeks.  ? ? ?HPI ?Patient is in today for the following: ? ?Works as Press photographer- ? ?Upper back pain 2-3 months ?Dull-aching pain right now. ?Worse after work, burning, sharp pain. Left shoulder into neck. Goes into L upper arm. ?Massages not helping. Ibuprofen 400 mg BID for the last week on empty stomach. Heating pad prn.  ?Right handed.  ? ?Abdominal pain ?Early in the mornings, epigastric, gnawing pain going into back x 2 weeks. ?Feels the same as a few years ago. "Something feels like it's eating my stomach." ?Nausea, no vomiting.  ?She was dx'd with GERD and started on Nexium. ?TUMS not helping. 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 ? ? ? ?Past Medical History:  ?Diagnosis Date  ? Fatty liver 10/24/2016  ? Refer to GI  ? Gastritis   ? easinophilic   ? GERD (gastroesophageal reflux disease)   ? Irregular periods/menstrual cycles 08/10/2015  ? Menstrual cramps 08/10/2015  ? Pelvic pain in female   ? ? ?Past Surgical History:  ?Procedure Laterality Date  ? BIOPSY  12/26/2016  ? Procedure: BIOPSY;  Surgeon: Danie Binder, MD;  [facility];  Service: Endoscopy;;  gastric ?duodenal  ? COLONOSCOPY N/A 12/26/2016  ? Procedure: COLONOSCOPY;  Surgeon: Danie Binder, MD;  [facility];  Service: Endoscopy;  Laterality: N/A;  1:00pm  ? ESOPHAGOGASTRODUODENOSCOPY N/A 12/26/2016  ? Procedure: ESOPHAGOGASTRODUODENOSCOPY (EGD);  Surgeon: Danie Binder, MD;  [facility];  Service:  Endoscopy;  Laterality: N/A;  ? NO PAST SURGERIES    ? ? ?Family History  ?Problem Relation Age of Onset  ? Arthritis Mother   ? High blood pressure Mother   ? Hypertension Father   ? Diabetes Maternal Grandmother   ? Hypertension Paternal Grandmother   ? Hypertension Paternal Grandfather   ? Diabetes Paternal Grandfather   ? Inflammatory bowel disease Neg Hx   ? Liver disease Neg Hx   ? Celiac disease Neg Hx   ? Colon cancer Neg Hx   ? ? ?Social History  ? ?Tobacco Use  ? Smoking status: Never  ? Smokeless tobacco: Never  ?Vaping Use  ? Vaping Use: Never used  ?Substance Use Topics  ? Alcohol use: Not Currently  ? Drug use: No  ?  ? ?No Known Allergies ? ?Review of Systems ?NEGATIVE UNLESS OTHERWISE INDICATED IN HPI ? ? ?   ?Objective:  ?  ? ?BP 124/88 (BP Location: Left Arm, Patient Position: Sitting, Cuff Size: Large)   Pulse 91   Temp 98.2 ?F (36.8 ?C) (Temporal)   Ht 5\' 4"  (1.626 m)   Wt 229 lb (103.9 kg)   LMP 09/17/2021 (Exact Date)   SpO2 98%   BMI 39.31 kg/m?  ? ?Wt Readings from Last 3 Encounters:  ?10/14/21 229 lb (103.9 kg)  ?08/12/21 248 lb (112.5 kg)  ?07/11/21 258 lb  8 oz (117.3 kg)  ? ? ?BP Readings from Last 3 Encounters:  ?10/14/21 124/88  ?08/12/21 110/78  ?07/11/21 128/90  ?  ? ?Physical Exam ?Vitals and nursing note reviewed.  ?Constitutional:   ?   Appearance: Normal appearance. She is normal weight. She is not toxic-appearing.  ?HENT:  ?   Head: Normocephalic and atraumatic.  ?   Right Ear: Tympanic membrane, ear canal and external ear normal.  ?   Left Ear: Tympanic membrane, ear canal and external ear normal.  ?   Nose: Nose normal.  ?   Mouth/Throat:  ?   Mouth: Mucous membranes are moist.  ?Eyes:  ?   Extraocular Movements: Extraocular movements intact.  ?   Conjunctiva/sclera: Conjunctivae normal.  ?   Pupils: Pupils are equal, round, and reactive to light.  ?Cardiovascular:  ?   Rate and Rhythm: Normal rate and regular rhythm.  ?   Pulses: Normal pulses.  ?   Heart sounds: Normal  heart sounds.  ?Pulmonary:  ?   Effort: Pulmonary effort is normal.  ?   Breath sounds: Normal breath sounds.  ?Abdominal:  ?   General: Abdomen is flat. Bowel sounds are normal.  ?   Palpations: Abdomen is soft. There is no mass.  ?   Tenderness: There is no abdominal tenderness.  ?Musculoskeletal:     ?   General: Normal range of motion.  ?   Left shoulder: No swelling or deformity. Normal range of motion.  ?     Arms: ? ?   Cervical back: Normal range of motion and neck supple.  ?Skin: ?   General: Skin is warm and dry.  ?Neurological:  ?   General: No focal deficit present.  ?   Mental Status: She is alert and oriented to person, place, and time.  ?Psychiatric:     ?   Mood and Affect: Mood normal.     ?   Behavior: Behavior normal.     ?   Thought Content: Thought content normal.     ?   Judgment: Judgment normal.  ? ? ?   ?Assessment & Plan:  ? ?Problem List Items Addressed This Visit   ? ?  ? Digestive  ? GERD (gastroesophageal reflux disease)  ? Relevant Medications  ? pantoprazole (PROTONIX) 40 MG tablet  ? ?Other Visit Diagnoses   ? ? Pain of left shoulder region    -  Primary  ? ?  ? ? ? ?Meds ordered this encounter  ?Medications  ? pantoprazole (PROTONIX) 40 MG tablet  ?  Sig: Take 1 tablet (40 mg total) by mouth daily.  ?  Dispense:  30 tablet  ?  Refill:  0  ?  Order Specific Question:   Supervising Provider  ?  Answer:   Marin Olp [id number]  ? methylPREDNISolone (MEDROL DOSEPAK) 4 MG TBPK tablet  ?  Sig: Please take per packaging instructions.  ?  Dispense:  21 tablet  ?  Refill:  0  ?  Order Specific Question:   Supervising Provider  ?  Answer:   Marin Olp [id number]  ? ?PLAN: ?Very good to meet you today. ?I think that the increased use of ibuprofen as well as the recent increase in [facility] has flared up your gastritis. ?Please take the Protonix 40 mg once daily prior to meals for at least the next 30 days. ?Also start on Medrol Dosepak to help alleviate both symptoms of the gastritis and  left shoulder pain. ?You can use Voltaren gel massage into the shoulder at least 3 times daily as well as ice and stretches. ? ?Please recheck if symptoms do not improve or change in any way. ? ?You can consider physical therapy for the shoulder area if you do not see a significant difference. ? ?This note was prepared with assistance of Dragon voice recognition software. Occasional wrong-word or sound-a-like substitutions may have occurred due to the inherent limitations of voice recognition software. ? ?Time Spent: ?33 minutes of total time was spent on the date of the encounter performing the following actions: chart review prior to seeing the patient, obtaining history, performing a medically necessary exam, counseling on the treatment plan, placing orders, and documenting in our EHR.   ? ?Kiet Geer M Makhiya Coburn, PA-C ?

## 2021-10-19 ENCOUNTER — Encounter: Payer: Self-pay | Admitting: Physician Assistant

## 2021-10-20 ENCOUNTER — Other Ambulatory Visit: Payer: Self-pay

## 2021-10-20 MED ORDER — WEGOVY 2.4 MG/0.75ML ~~LOC~~ SOAJ
2.4000 mg | SUBCUTANEOUS | 0 refills | Status: DC
  Filled 2021-10-20 – 2021-10-26 (×2): qty 3, 28d supply, fill #0

## 2021-10-21 NOTE — Telephone Encounter (Signed)
Received fax from pharmacy PA needed for Wegovy 2.4 mg. [employer] [facility] request form filled out and faxed to [phone]. Awaiting response. ?

## 2021-10-26 ENCOUNTER — Other Ambulatory Visit: Payer: Self-pay

## 2021-10-26 NOTE — Telephone Encounter (Signed)
Received fax from [employer],  Reginal Lutes has been approved 10/24/2021 through 10/24/2022.  ?[facility] pharmacy spoke to [place], told her Reginal Lutes has been approved for pt. Morrie Sheldon said they were aware and Rx is ready for pt and she received a My chart message saying Rx ready. Told her okay, thank you. ?

## 2021-11-15 ENCOUNTER — Encounter: Payer: Self-pay | Admitting: Physician Assistant

## 2021-11-15 ENCOUNTER — Other Ambulatory Visit: Payer: Self-pay

## 2021-11-15 MED ORDER — WEGOVY 2.4 MG/0.75ML ~~LOC~~ SOAJ
2.4000 mg | SUBCUTANEOUS | 0 refills | Status: DC
  Filled 2021-11-15: qty 3, 28d supply, fill #0

## 2021-11-16 ENCOUNTER — Ambulatory Visit: Payer: No Typology Code available for payment source | Admitting: Physician Assistant

## 2021-11-17 ENCOUNTER — Other Ambulatory Visit: Payer: Self-pay

## 2021-11-18 ENCOUNTER — Encounter: Payer: Self-pay | Admitting: Physician Assistant

## 2021-11-18 ENCOUNTER — Ambulatory Visit (INDEPENDENT_AMBULATORY_CARE_PROVIDER_SITE_OTHER): Payer: No Typology Code available for payment source | Admitting: Physician Assistant

## 2021-11-18 VITALS — BP 112/80 | HR 76 | Temp 97.9°F | Ht 64.0 in | Wt 221.2 lb

## 2021-11-18 DIAGNOSIS — E669 Obesity, unspecified: Secondary | ICD-10-CM

## 2021-11-18 DIAGNOSIS — K2100 Gastro-esophageal reflux disease with esophagitis, without bleeding: Secondary | ICD-10-CM | POA: Diagnosis not present

## 2021-11-18 NOTE — Progress Notes (Signed)
Jordan Jackson is a 26 y.o. female here for a follow up of a pre-existing problem.   History of Present Illness:   Chief Complaint  Patient presents with   Obesity    HPI  Obesity  Patient here to 3 months follow up. She is currently compliant with taking Wegovy 2.4 mg weekly injection with no adverse side effects. She has noticed weight loss with this medication. She has lost about 37 pounds since starting the medication. She reports she is interested to get to around 190 lb prior to stopping and trying to conceive. She has had severe constipation since starting the medication but this has improved with extra water intake. Has no complaints about her medications and is overall doing well. She notes medication is affordable and has no issue. She is managing well and denies any worsening symptoms.   GERD Patient is currently taking Protonix 40 mg as needed. She states she takes this few times a week. Tolerating well. No side effects. Symptoms are manageable and seem to be improving as she loses weight.  Past Medical History:  Diagnosis Date   Fatty liver 10/24/2016   Refer to GI   Gastritis    easinophilic    GERD (gastroesophageal reflux disease)    Irregular periods/menstrual cycles 08/10/2015   Menstrual cramps 08/10/2015   Pelvic pain in female      Social History   Tobacco Use   Smoking status: Never   Smokeless tobacco: Never  Vaping Use   Vaping Use: Never used  Substance Use Topics   Alcohol use: Not Currently   Drug use: No    Past Surgical History:  Procedure Laterality Date   BIOPSY  12/26/2016   Procedure: BIOPSY;  Surgeon: West Bali, MD;  [facility];  Service: Endoscopy;;  gastric duodenal   COLONOSCOPY N/A 12/26/2016   Procedure: COLONOSCOPY;  Surgeon: West Bali, MD;  [facility];  Service: Endoscopy;  Laterality: N/A;  1:00pm   ESOPHAGOGASTRODUODENOSCOPY N/A 12/26/2016   Procedure: ESOPHAGOGASTRODUODENOSCOPY (EGD);   Surgeon: West Bali, MD;  [facility];  Service: Endoscopy;  Laterality: N/A;   NO PAST SURGERIES      Family History  Problem Relation Age of Onset   Arthritis Mother    High blood pressure Mother    Hypertension Father    Diabetes Maternal Grandmother    Hypertension Paternal Grandmother    Hypertension Paternal Grandfather    Diabetes Paternal Grandfather    Inflammatory bowel disease Neg Hx    Liver disease Neg Hx    Celiac disease Neg Hx    Colon cancer Neg Hx     No Known Allergies  Current Medications:   Current Outpatient Medications:    clindamycin (CLEOCIN T) 1 % external solution, Apply to affected areas in the morning, Disp: 60 mL, Rfl: PRN   desogestrel-ethinyl estradiol (APRI) 0.15-30 MG-MCG tablet, Take 1 tablet every day by oral route., Disp: 84 tablet, Rfl: 4   Fluocinolone Acetonide Scalp (DERMA-SMOOTHE/FS SCALP) 0.01 % OIL, apply to affected area at bedtime and wash off upon waking, Disp: 118.28 mL, Rfl: 3   Semaglutide-Weight Management (WEGOVY) 2.4 MG/0.75ML SOAJ, Inject 2.4 mg into the skin once a week., Disp: 3 mL, Rfl: 0   pantoprazole (PROTONIX) 40 MG tablet, Take 1 tablet (40 mg total) by mouth daily., Disp: 30 tablet, Rfl: 0   Review of Systems:   ROS Negative unless otherwise specified per HPI.  Vitals:   Vitals:   11/18/21 1110  BP: 112/80  Pulse: 76  Temp: 97.9 F (36.6 C)  TempSrc: Temporal  SpO2: 98%  Weight: 221 lb 4 oz (100.4 kg)  Height: 5\' 4"  (1.626 m)     Body mass index is 37.98 kg/m.  Physical Exam:   Physical Exam Vitals and nursing note reviewed.  Constitutional:      General: She is not in acute distress.    Appearance: She is well-developed. She is not ill-appearing or toxic-appearing.  Cardiovascular:     Rate and Rhythm: Normal rate and regular rhythm.     Pulses: Normal pulses.     Heart sounds: Normal heart sounds, S1 normal and S2 normal.  Pulmonary:     Effort: Pulmonary effort is normal.      Breath sounds: Normal breath sounds.  Skin:    General: Skin is warm and dry.  Neurological:     Mental Status: She is alert.     GCS: GCS eye subscore is 4. GCS verbal subscore is 5. GCS motor subscore is 6.  Psychiatric:        Speech: Speech normal.        Behavior: Behavior normal. Behavior is cooperative.    Assessment and Plan:   Obesity, unspecified classification, unspecified obesity type, unspecified whether serious comorbidity present Doing well Continue to work on exercise and healthy eating Continue OCP Recommend discontinuation at least 2 months prior to conception efforts  Gastroesophageal reflux disease with esophagitis, unspecified whether hemorrhage Improving Continue protonix 40 mg prn  I,Savera Zaman,acting as a scribe for , PA.,have documented all relevant documentation on the behalf of [employer], PA,as directed by  Jarold Motto, PA while in the presence of Jarold Motto, Jarold Motto.   I, [place], Jarold Motto, have reviewed all documentation for this visit. The documentation on 11/18/21 for the exam, diagnosis, procedures, and orders are all accurate and complete.   11/20/21, PA-C

## 2021-11-23 ENCOUNTER — Other Ambulatory Visit: Payer: Self-pay

## 2021-11-28 ENCOUNTER — Ambulatory Visit (INDEPENDENT_AMBULATORY_CARE_PROVIDER_SITE_OTHER): Payer: No Typology Code available for payment source

## 2021-11-28 ENCOUNTER — Encounter: Payer: Self-pay | Admitting: Physician Assistant

## 2021-11-28 ENCOUNTER — Ambulatory Visit (INDEPENDENT_AMBULATORY_CARE_PROVIDER_SITE_OTHER): Payer: No Typology Code available for payment source | Admitting: Physician Assistant

## 2021-11-28 VITALS — BP 120/80 | HR 95 | Temp 98.2°F | Ht 64.0 in | Wt 220.4 lb

## 2021-11-28 DIAGNOSIS — R42 Dizziness and giddiness: Secondary | ICD-10-CM

## 2021-11-28 DIAGNOSIS — R002 Palpitations: Secondary | ICD-10-CM

## 2021-11-28 NOTE — Patient Instructions (Signed)
It was great to see you!  We are going to send you to cardiology and also go ahead and order a heart monitor.  If new symptoms, let us know.  Take care,  Jarold Motto PA-C

## 2021-11-28 NOTE — Progress Notes (Unsigned)
Enrolled for [employer] to mail a ZIO XT long term holter monitor to the patients address on file.  

## 2021-11-28 NOTE — Progress Notes (Addendum)
Jordan Jackson is a 26 y.o. female here for a follow up of a pre-existing problem.  History of Present Illness:   Chief Complaint  Patient presents with   Dizziness    Pt c/o dizziness off and on x several months, worse past 2 weeks.  She has not tried anything.   Palpitations    Pt c/o heart palpitations off and on x several weeks.    HPI  Dizziness  Patient presents with feelings of "light lightheadedness" that has been present for the past several months. Symptoms have worsened for the past 2 weeks. She notes she has been feeling different symptoms at times. Sometimes she feel constant lightheadedness. She states she has been feeling dizziness when laying down -- where she describes it as herself spinning. Comes and goes. She states this has happened few times during the past few months. She has had similar issue in the past and was given Meclizine at that time. Denies double vision, changes in vision, recent injury/illness, chest pain.    Heart palpitation  Patient complain of heart palpation that has been present for the past several weeks. She described the feeling as a heart racing and pounding feeling. She notes she has had felt a pounding sensation in her whole body when going from squat position to standing. Symptoms comes and goes.  Denies excessive caffeine intake, fever, SOB, chest pain, LE swelling.   She was evaluated by her old PCP for this and wore a 24-hour blood pressure monitor -- this was normal. She was also given meclizine for this.  Denies concerns for pregnancy. She is on OCP.  Past Medical History:  Diagnosis Date   Fatty liver 10/24/2016   Refer to GI   Gastritis    easinophilic    GERD (gastroesophageal reflux disease)    Irregular periods/menstrual cycles 08/10/2015   Menstrual cramps 08/10/2015   Pelvic pain in female      Social History   Tobacco Use   Smoking status: Never   Smokeless tobacco: Never  Vaping Use   Vaping Use: Never used   Substance Use Topics   Alcohol use: Not Currently   Drug use: No    Past Surgical History:  Procedure Laterality Date   BIOPSY  12/26/2016   Procedure: BIOPSY;  Surgeon: Danie Binder, MD;  [facility];  Service: Endoscopy;;  gastric duodenal   COLONOSCOPY N/A 12/26/2016   Procedure: COLONOSCOPY;  Surgeon: Danie Binder, MD;  [facility];  Service: Endoscopy;  Laterality: N/A;  1:00pm   ESOPHAGOGASTRODUODENOSCOPY N/A 12/26/2016   Procedure: ESOPHAGOGASTRODUODENOSCOPY (EGD);  Surgeon: Danie Binder, MD;  [facility];  Service: Endoscopy;  Laterality: N/A;   NO PAST SURGERIES      Family History  Problem Relation Age of Onset   Arthritis Mother    High blood pressure Mother    Hypertension Father    Diabetes Maternal Grandmother    Hypertension Paternal Grandmother    Hypertension Paternal Grandfather    Diabetes Paternal Grandfather    Inflammatory bowel disease Neg Hx    Liver disease Neg Hx    Celiac disease Neg Hx    Colon cancer Neg Hx     No Known Allergies  Current Medications:   Current Outpatient Medications:    clindamycin (CLEOCIN T) 1 % external solution, Apply to affected areas in the morning, Disp: 60 mL, Rfl: PRN   desogestrel-ethinyl estradiol (APRI) 0.15-30 MG-MCG tablet, Take 1 tablet  every day by oral route., Disp: 84 tablet, Rfl: 4   Fluocinolone Acetonide Scalp (DERMA-SMOOTHE/FS SCALP) 0.01 % OIL, apply to affected area at bedtime and wash off upon waking, Disp: 118.28 mL, Rfl: 3   Semaglutide-Weight Management (WEGOVY) 2.4 MG/0.75ML SOAJ, Inject 2.4 mg into the skin once a week., Disp: 3 mL, Rfl: 0   pantoprazole (PROTONIX) 40 MG tablet, Take 1 tablet (40 mg total) by mouth daily., Disp: 30 tablet, Rfl: 0   Review of Systems:   ROS Negative unless otherwise specified per HPI.   Vitals:   Vitals:   11/28/21 0949  BP: 120/80  Pulse: 95  Temp: 98.2 F (36.8 C)  TempSrc: Temporal  SpO2: 99%  Weight:  220 lb 6.1 oz (100 kg)  Height: 5\' 4"  (1.626 m)     Body mass index is 37.83 kg/m.  Physical Exam:   Physical Exam Vitals and nursing note reviewed.  Constitutional:      General: She is not in acute distress.    Appearance: She is well-developed. She is not ill-appearing or toxic-appearing.  Cardiovascular:     Rate and Rhythm: Normal rate and regular rhythm.     Pulses: Normal pulses.     Heart sounds: Normal heart sounds, S1 normal and S2 normal.  Pulmonary:     Effort: Pulmonary effort is normal.     Breath sounds: Normal breath sounds.  Skin:    General: Skin is warm and dry.  Neurological:     General: No focal deficit present.     Mental Status: She is alert.     GCS: GCS eye subscore is 4. GCS verbal subscore is 5. GCS motor subscore is 6.  Psychiatric:        Speech: Speech normal.        Behavior: Behavior normal. Behavior is cooperative.    Assessment and Plan:   Palpitations; Lightheadedness EKG tracing is personally reviewed.  EKG notes sinus arrhythmia.  No acute changes.  Heart monitor ordered for 7 days Cardiology referral placed Blood work recently done in the time frame that she was experiencing symptoms, so we decided not to repeat this today Advised her to let us know if new/worsening symptoms If all of her testing/cardiology work-up is negative, will consider further evaluation for lightheadedness/dizziness as indicated based on clinical symptoms  I,Savera Zaman,acting as a scribe for Inda Coke, PA.,have documented all relevant documentation on the behalf of Inda Coke, PA,as directed by  Inda Coke, PA while in the presence of Inda Coke, [place].   I, Inda Coke, [place], have reviewed all documentation for this visit. The documentation on 11/28/21 for the exam, diagnosis, procedures, and orders are all accurate and complete.  Inda Coke, PA-C

## 2021-11-30 ENCOUNTER — Ambulatory Visit: Payer: No Typology Code available for payment source | Admitting: Physician Assistant

## 2021-11-30 DIAGNOSIS — R002 Palpitations: Secondary | ICD-10-CM | POA: Diagnosis not present

## 2021-11-30 DIAGNOSIS — R42 Dizziness and giddiness: Secondary | ICD-10-CM | POA: Diagnosis not present

## 2021-12-01 NOTE — Progress Notes (Signed)
Cardiology Office Note:   Date:  12/02/2021  NAME:  Jordan Jackson    MRN: [id number] DOB:  02/11/1996   PCP:  Jarold MottoWorley, Samantha, PA  Cardiologist:  None  Electrophysiologist:  None   Referring MD: Jarold MottoWorley, Samantha, PA   Chief Complaint  Patient presents with   Palpitations    History of Present Illness:   Jordan HeadingsKayla M Jackson is a 26 y.o. female with a hx of GERD who is being seen today for the evaluation of palpitations at the request of Jarold MottoWorley, Samantha, [place].  She reports for the last 2 to 3 weeks she has had episodes of rapid heartbeat sensation.  Symptoms occur with changing position.  She reports when going from sitting to standing she can notice her heart rate is rapid for roughly 30 seconds.  She also describes some dizziness with this.  She can get a little winded as well.  She reports that she has had similar feelings that can last seconds for years.  She reports a little bit of dizziness upon standing.  This has been ongoing for years.  Symptoms are a bit worse over the past 2 to 3 weeks.  Symptoms have coincided with going to full dose of Wegovy.  She reports she is drinking 60+ ounces of water daily.  No caffeine consumption.  No stress.  She is currently wearing a monitor.  Her exam is normal today.  No strong family history of heart disease.  She does not smoke.  No alcohol or drug use is reported.  She does work as a Engineer, civil (consulting)nurse.  Overall seems to be doing quite well.  EKG from primary care physician was normal.  Most recent lab work shows she is not anemic.  Recent TSH 2.54.  I have a strong suspicion that Wegovy could be causing her symptoms.  Monitor will exclude any arrhythmia.  TSH 2.54  Past Medical History: Past Medical History:  Diagnosis Date   Fatty liver 10/24/2016   Refer to GI   Gastritis    easinophilic    GERD (gastroesophageal reflux disease)    Irregular periods/menstrual cycles 08/10/2015   Menstrual cramps 08/10/2015   Pelvic pain in female     Past Surgical  History: Past Surgical History:  Procedure Laterality Date   BIOPSY  12/26/2016   Procedure: BIOPSY;  Surgeon: West BaliFields, Sandi L, MD;  [facility];  Service: Endoscopy;;  gastric duodenal   COLONOSCOPY N/A 12/26/2016   Procedure: COLONOSCOPY;  Surgeon: West BaliFields, Sandi L, MD;  [facility];  Service: Endoscopy;  Laterality: N/A;  1:00pm   ESOPHAGOGASTRODUODENOSCOPY N/A 12/26/2016   Procedure: ESOPHAGOGASTRODUODENOSCOPY (EGD);  Surgeon: West BaliFields, Sandi L, MD;  [facility];  Service: Endoscopy;  Laterality: N/A;   NO PAST SURGERIES      Current Medications: Current Meds  Medication Sig   clindamycin (CLEOCIN T) 1 % external solution Apply to affected areas in the morning   desogestrel-ethinyl estradiol (APRI) 0.15-30 MG-MCG tablet Take 1 tablet every day by oral route.   Fluocinolone Acetonide Scalp (DERMA-SMOOTHE/FS SCALP) 0.01 % OIL apply to affected area at bedtime and wash off upon waking   pantoprazole (PROTONIX) 40 MG tablet Take 1 tablet (40 mg total) by mouth daily.   Semaglutide-Weight Management (WEGOVY) 2.4 MG/0.75ML SOAJ Inject 2.4 mg into the skin once a week.     Allergies:    Patient has no known allergies.   Social History: Social History   Socioeconomic History   Marital  status: Married    Spouse name: Not on file   Number of children: 0   Years of education: Not on file   Highest education level: Not on file  Occupational History   Occupation: Psychologist, sport and exercise   Occupation: RN  Tobacco Use   Smoking status: Never   Smokeless tobacco: Never  Vaping Use   Vaping Use: Never used  Substance and Sexual Activity   Alcohol use: Not Currently   Drug use: No   Sexual activity: Yes    Birth control/protection: None  Other Topics Concern   Not on file  Social History Narrative   RN for NICU   Night shifts   Live with husband   Social Determinants of Health   Financial Resource Strain: Not on file  Food Insecurity: Not on file   Transportation Needs: Not on file  Physical Activity: Not on file  Stress: Not on file  Social Connections: Not on file     Family History: The patient's family history includes Arthritis in her mother; Diabetes in her maternal grandmother and paternal grandfather; High blood pressure in her mother; Hypertension in her father, paternal grandfather, and paternal grandmother. There is no history of Inflammatory bowel disease, Liver disease, Celiac disease, or Colon cancer.  ROS:   All other ROS reviewed and negative. Pertinent positives noted in the HPI.     EKGs/Labs/Other Studies Reviewed:   The following studies were personally reviewed by me today:  EKG:  EKG is reviewed from primary care physician dated 11/28/2021.  That EKG demonstrates sinus rhythm heart rate 84 with no acute ischemic changes or evidence of infarction.    Recent Labs: 07/11/2021: Hemoglobin 13.6; Platelets 399.0; TSH 2.54 07/25/2021: ALT 34; BUN 13; Creatinine, Ser 0.69; Potassium 4.2; Sodium 139   Recent Lipid Panel No results found for: "CHOL", "TRIG", "HDL", "CHOLHDL", "VLDL", "LDLCALC", "LDLDIRECT"  Physical Exam:   VS:  BP 126/86 (BP Location: Left Arm, Patient Position: Sitting, Cuff Size: Large)   Pulse (!) 108   Ht 5\' 4"  (1.626 m)   Wt 219 lb 12.8 oz (99.7 kg)   LMP 10/31/2021 (Exact Date)   SpO2 96%   BMI 37.73 kg/m    Wt Readings from Last 3 Encounters:  12/02/21 219 lb 12.8 oz (99.7 kg)  11/28/21 220 lb 6.1 oz (100 kg)  11/18/21 221 lb 4 oz (100.4 kg)    General: Well nourished, well developed, in no acute distress Head: Atraumatic, normal size  Eyes: PEERLA, EOMI  Neck: Supple, no JVD Endocrine: No thryomegaly Cardiac: Normal S1, S2; RRR; no murmurs, rubs, or gallops Lungs: Clear to auscultation bilaterally, no wheezing, rhonchi or rales  Abd: Soft, nontender, no hepatomegaly  Ext: No edema, pulses 2+ Musculoskeletal: No deformities, BUE and BLE strength normal and equal Skin: Warm and  dry, no rashes   Neuro: Alert and oriented to person, place, time, and situation, CNII-XII grossly intact, no focal deficits  Psych: Normal mood and affect   ASSESSMENT:   SHAILI DONALSON is a 26 y.o. female who presents for the following: 1. Palpitations   2. SOB (shortness of breath) on exertion   3. Dizziness     PLAN:   1. Palpitations 2. SOB (shortness of breath) on exertion 3. Dizziness -She reports symptoms of dizziness and tachycardia upon position change.  She has had the symptoms for years.  They are more pronounced.  They have coincided with increased dose of Wegovy.  Recent CBC is normal.  Recent  TSH is normal.  EKG is normal.  CV exam is normal.  She is currently wearing a monitor by her primary care physician to exclude arrhythmia.  I agree with this.  She is drinking plenty of water.  No excess caffeine consumption.  No increased stress.  Overall suspect her symptoms are likely related to [facility].  If monitor is normal would recommend to cut back on the dose to see if this helps.  In the meantime she can maintain her current level of activity and drink plenty of water.  She can also increase her salt consumption if needed.  Disposition: Return if symptoms worsen or fail to improve.  Medication Adjustments/Labs and Tests Ordered: Current medicines are reviewed at length with the patient today.  Concerns regarding medicines are outlined above.  No orders of the defined types were placed in this encounter.  No orders of the defined types were placed in this encounter.   Patient Instructions  Medication Instructions:  The current medical regimen is effective;  continue present plan and medications.  *If you need a refill on your cardiac medications before your next appointment, please call your pharmacy*   Follow-Up: At [facility], you and your health needs are our priority.  As part of our continuing mission to provide you with exceptional heart care, we have created  designated Provider Care Teams.  These Care Teams include your primary Cardiologist (physician) and Advanced Practice Providers (APPs -  Physician Assistants and Nurse Practitioners) who all work together to provide you with the care you need, when you need it.  We recommend signing up for the patient portal called "MyChart".  Sign up information is provided on this After Visit Summary.  MyChart is used to connect with patients for Virtual Visits (Telemedicine).  Patients are able to view lab/test results, encounter notes, upcoming appointments, etc.  Non-urgent messages can be sent to your provider as well.   To learn more about what you can do with MyChart, go to [email or web address].    Your next appointment:   As needed  The format for your next appointment:   In Person  Provider:   Lennie Odor, MD             Signed, Lenna Gilford. Flora Lipps, MD, [facility]  [facility]  [address] [place], [place] [address] [phone]  12/02/2021 9:15 AM

## 2021-12-02 ENCOUNTER — Other Ambulatory Visit: Payer: Self-pay

## 2021-12-02 ENCOUNTER — Encounter: Payer: Self-pay | Admitting: Cardiovascular Disease

## 2021-12-02 ENCOUNTER — Ambulatory Visit (INDEPENDENT_AMBULATORY_CARE_PROVIDER_SITE_OTHER): Payer: No Typology Code available for payment source | Admitting: Cardiovascular Disease

## 2021-12-02 VITALS — BP 126/86 | HR 108 | Ht 64.0 in | Wt 219.8 lb

## 2021-12-02 DIAGNOSIS — R002 Palpitations: Secondary | ICD-10-CM

## 2021-12-02 DIAGNOSIS — R42 Dizziness and giddiness: Secondary | ICD-10-CM | POA: Diagnosis not present

## 2021-12-02 DIAGNOSIS — R0602 Shortness of breath: Secondary | ICD-10-CM | POA: Diagnosis not present

## 2021-12-02 MED ORDER — ETONOGESTREL-ETHINYL ESTRADIOL 0.12-0.015 MG/24HR VA RING
VAGINAL_RING | VAGINAL | 1 refills | Status: DC
  Filled 2021-12-02: qty 3, 84d supply, fill #0

## 2021-12-02 NOTE — Patient Instructions (Signed)
Medication Instructions:  The current medical regimen is effective;  continue present plan and medications.  *If you need a refill on your cardiac medications before your next appointment, please call your pharmacy*    Follow-Up: At [facility], you and your health needs are our priority.  As part of our continuing mission to provide you with exceptional heart care, we have created designated Provider Care Teams.  These Care Teams include your primary Cardiologist (physician) and Advanced Practice Providers (APPs -  Physician Assistants and Nurse Practitioners) who all work together to provide you with the care you need, when you need it.  We recommend signing up for the patient portal called "MyChart".  Sign up information is provided on this After Visit Summary.  MyChart is used to connect with patients for Virtual Visits (Telemedicine).  Patients are able to view lab/test results, encounter notes, upcoming appointments, etc.  Non-urgent messages can be sent to your provider as well.   To learn more about what you can do with MyChart, go to [email or web address].    Your next appointment:   As needed  The format for your next appointment:   In Person  Provider:   [place] O'Neal, MD      

## 2021-12-14 ENCOUNTER — Encounter: Payer: Self-pay | Admitting: Cardiovascular Disease

## 2022-02-11 DIAGNOSIS — U071 COVID-19: Secondary | ICD-10-CM | POA: Insufficient documentation

## 2022-03-01 LAB — OB RESULTS CONSOLE GC/CHLAMYDIA
Chlamydia: NEGATIVE
Neisseria Gonorrhea: NEGATIVE

## 2022-03-01 LAB — OB RESULTS CONSOLE HEPATITIS B SURFACE ANTIGEN: Hepatitis B Surface Ag: NEGATIVE

## 2022-03-01 LAB — OB RESULTS CONSOLE RUBELLA ANTIBODY, IGM: Rubella: IMMUNE

## 2022-03-01 LAB — HM PAP SMEAR

## 2022-03-01 LAB — OB RESULTS CONSOLE HIV ANTIBODY (ROUTINE TESTING): HIV: NONREACTIVE

## 2022-03-01 LAB — RESULTS CONSOLE HPV: CHL HPV: NEGATIVE

## 2022-03-20 ENCOUNTER — Encounter: Payer: Self-pay | Admitting: *Deleted

## 2022-03-22 ENCOUNTER — Other Ambulatory Visit: Payer: Self-pay

## 2022-03-22 MED ORDER — TARON-C DHA 35-1 MG PO CAPS
1.0000 | ORAL_CAPSULE | Freq: Every day | ORAL | 5 refills | Status: DC
  Filled 2022-03-22: qty 30, 30d supply, fill #0
  Filled 2022-05-08: qty 30, 30d supply, fill #1
  Filled 2022-07-26: qty 30, 30d supply, fill #2

## 2022-03-23 ENCOUNTER — Other Ambulatory Visit: Payer: Self-pay

## 2022-03-26 ENCOUNTER — Inpatient Hospital Stay (HOSPITAL_COMMUNITY): Payer: No Typology Code available for payment source

## 2022-03-26 ENCOUNTER — Other Ambulatory Visit: Payer: Self-pay

## 2022-03-26 ENCOUNTER — Encounter (HOSPITAL_COMMUNITY): Payer: Self-pay

## 2022-03-26 ENCOUNTER — Inpatient Hospital Stay (HOSPITAL_COMMUNITY)
Admission: AD | Admit: 2022-03-26 | Discharge: 2022-03-26 | Disposition: A | Discharge status: Home / Self Care | Payer: No Typology Code available for payment source | Attending: Obstetrics and Gynecology | Admitting: Obstetrics and Gynecology

## 2022-03-26 DIAGNOSIS — Z3A12 12 weeks gestation of pregnancy: Secondary | ICD-10-CM | POA: Diagnosis not present

## 2022-03-26 DIAGNOSIS — R109 Unspecified abdominal pain: Secondary | ICD-10-CM | POA: Insufficient documentation

## 2022-03-26 DIAGNOSIS — O418X20 Other specified disorders of amniotic fluid and membranes, second trimester, not applicable or unspecified: Secondary | ICD-10-CM

## 2022-03-26 DIAGNOSIS — O208 Other hemorrhage in early pregnancy: Secondary | ICD-10-CM | POA: Insufficient documentation

## 2022-03-26 DIAGNOSIS — O468X2 Other antepartum hemorrhage, second trimester: Secondary | ICD-10-CM | POA: Diagnosis not present

## 2022-03-26 DIAGNOSIS — O26891 Other specified pregnancy related conditions, first trimester: Secondary | ICD-10-CM | POA: Diagnosis not present

## 2022-03-26 DIAGNOSIS — O209 Hemorrhage in early pregnancy, unspecified: Secondary | ICD-10-CM

## 2022-03-26 LAB — URINALYSIS, ROUTINE W REFLEX MICROSCOPIC
Bacteria, UA: NONE SEEN
Bilirubin Urine: NEGATIVE
Glucose, UA: NEGATIVE mg/dL
Ketones, ur: NEGATIVE mg/dL
Leukocytes,Ua: NEGATIVE
Nitrite: NEGATIVE
Protein, ur: NEGATIVE mg/dL
Specific Gravity, Urine: 1.012 (ref 1.005–1.030)
pH: 6 (ref 5.0–8.0)

## 2022-03-26 LAB — CBC
HCT: 36.9 % (ref 36.0–46.0)
Hemoglobin: 13.3 g/dL (ref 12.0–15.0)
MCH: 31.1 pg (ref 26.0–34.0)
MCHC: 36 g/dL (ref 30.0–36.0)
MCV: 86.4 fL (ref 80.0–100.0)
Platelets: 349 10*3/uL (ref 150–400)
RBC: 4.27 MIL/uL (ref 3.87–5.11)
RDW: 13.7 % (ref 11.5–15.5)
WBC: 9 10*3/uL (ref 4.0–10.5)
nRBC: 0 % (ref 0.0–0.2)

## 2022-03-26 LAB — ABO/RH: ABO/RH(D): O POS

## 2022-03-26 LAB — HCG, QUANTITATIVE, PREGNANCY: hCG, Beta Chain, Quant, S: 95421 m[IU]/mL — ABNORMAL HIGH (ref <5)

## 2022-03-26 NOTE — MAU Provider Note (Signed)
History     CSN: [id number]  Arrival date and time: 03/26/22 1653   Event Date/Time   First Provider Initiated Contact with Patient 03/26/22 1719      Chief Complaint  Patient presents with   Abdominal Pain   Rupture of Membranes   HPI  Jordan Jackson is a 26 y.o. G1P0000 at [redacted]w[redacted]d who presents for evaluation of vaginal bleeding. Patient reports she was sleeping and woke up with a cramp. She got up to go to the bathroom at 11 and that helped the pain. She reports at 1600 she woke up to a gush and bleeding. She reports it filled the toilet. She denies any pain. This has not happened before in the pregnancy.  She denies any discharge and leaking of fluid. Denies any constipation, diarrhea or any urinary complaints.  She reports a normal IUP in the office for prenatal care   OB History     Gravida  1   Para  0   Term  0   Preterm  0   AB  0   Living  0      SAB  0   IAB  0   Ectopic  0   Multiple  0   Live Births              Past Medical History:  Diagnosis Date   Fatty liver 10/24/2016   Refer to GI   Gastritis    easinophilic    GERD (gastroesophageal reflux disease)    Irregular periods/menstrual cycles 08/10/2015   Menstrual cramps 08/10/2015   Pelvic pain in female     Past Surgical History:  Procedure Laterality Date   BIOPSY  12/26/2016   Procedure: BIOPSY;  Surgeon: Danie Binder, MD;  [facility];  Service: Endoscopy;;  gastric duodenal   COLONOSCOPY N/A 12/26/2016   Procedure: COLONOSCOPY;  Surgeon: Danie Binder, MD;  [facility];  Service: Endoscopy;  Laterality: N/A;  1:00pm   ESOPHAGOGASTRODUODENOSCOPY N/A 12/26/2016   Procedure: ESOPHAGOGASTRODUODENOSCOPY (EGD);  Surgeon: Danie Binder, MD;  [facility];  Service: Endoscopy;  Laterality: N/A;   NO PAST SURGERIES      Family History  Problem Relation Age of Onset   Arthritis Mother    High blood pressure Mother    Hypertension  Father    Diabetes Maternal Grandmother    Hypertension Paternal Grandmother    Hypertension Paternal Grandfather    Diabetes Paternal Grandfather    Inflammatory bowel disease Neg Hx    Liver disease Neg Hx    Celiac disease Neg Hx    Colon cancer Neg Hx     Social History   Tobacco Use   Smoking status: Never   Smokeless tobacco: Never  Vaping Use   Vaping Use: Never used  Substance Use Topics   Alcohol use: Not Currently   Drug use: No    Allergies: No Known Allergies  Medications Prior to Admission  Medication Sig Dispense Refill Last Dose   clindamycin (CLEOCIN T) 1 % external solution Apply to affected areas in the morning 60 mL PRN    desogestrel-ethinyl estradiol (APRI) 0.15-30 MG-MCG tablet Take 1 tablet every day by oral route. 84 tablet 4    etonogestrel-ethinyl estradiol (NUVARING) 0.12-0.015 MG/24HR vaginal ring Insert 1 vaginal ring every month by vaginal route. 3 each 1    Fluocinolone Acetonide Scalp (DERMA-SMOOTHE/FS SCALP) 0.01 % OIL apply to affected area at bedtime and  wash off upon waking 118.28 mL 3    pantoprazole (PROTONIX) 40 MG tablet Take 1 tablet (40 mg total) by mouth daily. 30 tablet 0    Prenat-FeFum-FePo-FA-Omega 3 (TARON-C DHA) 35-1 MG CAPS Take 1 capsule by mouth daily. 30 capsule 5    Semaglutide-Weight Management (WEGOVY) 2.4 MG/0.75ML SOAJ Inject 2.4 mg into the skin once a week. 3 mL 0     Review of Systems  Constitutional: Negative.  Negative for fatigue and fever.  HENT: Negative.    Respiratory: Negative.  Negative for shortness of breath.   Cardiovascular: Negative.  Negative for chest pain.  Gastrointestinal: Negative.  Negative for abdominal pain, constipation, diarrhea, nausea and vomiting.  Genitourinary:  Positive for vaginal bleeding. Negative for dysuria.  Neurological: Negative.  Negative for dizziness and headaches.   Physical Exam   Blood pressure (!) 147/87, pulse (!) 107, temperature 98.9 F (37.2 C), resp. rate 16,  SpO2 100 %.  Patient Vitals for the past 24 hrs:  BP Temp Pulse Resp SpO2  03/26/22 1717 (!) 147/87 98.9 F (37.2 C) (!) 107 16 100 %    Physical Exam Vitals and nursing note reviewed.  Constitutional:      General: She is not in acute distress.    Appearance: She is well-developed.  HENT:     Head: Normocephalic.  Eyes:     Pupils: Pupils are equal, round, and reactive to light.  Cardiovascular:     Rate and Rhythm: Normal rate and regular rhythm.     Heart sounds: Normal heart sounds.  Pulmonary:     Effort: Pulmonary effort is normal. No respiratory distress.     Breath sounds: Normal breath sounds.  Abdominal:     General: Bowel sounds are normal. There is no distension.     Palpations: Abdomen is soft.     Tenderness: There is no abdominal tenderness.  Genitourinary:    Comments: Pelvic exam: Cervix pink, visually closed, without lesion, small amount of dark red blood in vault, vaginal walls and external genitalia normal   Skin:    General: Skin is warm and dry.  Neurological:     Mental Status: She is alert and oriented to person, place, and time.  Psychiatric:        Mood and Affect: Mood normal.        Behavior: Behavior normal.        Thought Content: Thought content normal.        Judgment: Judgment normal.      MAU Course  Procedures  Results for orders placed or performed during the hospital encounter of 03/26/22 (from the past 24 hour(s))  CBC     Status: None   Collection Time: 03/26/22  5:31 PM  Result Value Ref Range   WBC 9.0 4.0 - 10.5 K/uL   RBC 4.27 3.87 - 5.11 MIL/uL   Hemoglobin 13.3 12.0 - 15.0 g/dL   HCT 36.9 36.0 - 46.0 %   MCV 86.4 80.0 - 100.0 fL   MCH 31.1 26.0 - 34.0 pg   MCHC 36.0 30.0 - 36.0 g/dL   RDW 13.7 11.5 - 15.5 %   Platelets 349 150 - 400 K/uL   nRBC 0.0 0.0 - 0.2 %  hCG, quantitative, pregnancy     Status: Abnormal   Collection Time: 03/26/22  5:31 PM  Result Value Ref Range   hCG, Beta Chain, Quant, S 95,421 (H)  <5 mIU/mL  ABO/Rh     Status: None  Collection Time: 03/26/22  5:31 PM  Result Value Ref Range   ABO/RH(D) O POS    No rh immune globuloin      NOT A RH IMMUNE GLOBULIN CANDIDATE, PT RH POSITIVE Performed at [facility] [address] [place], [place] [address]   Urinalysis, Routine w reflex microscopic Urine, Clean Catch     Status: Abnormal   Collection Time: 03/26/22  6:40 PM  Result Value Ref Range   Color, Urine YELLOW YELLOW   APPearance CLEAR CLEAR   Specific Gravity, Urine 1.012 1.005 - 1.030   pH 6.0 5.0 - 8.0   Glucose, UA NEGATIVE NEGATIVE mg/dL   Hgb urine dipstick MODERATE (A) NEGATIVE   Bilirubin Urine NEGATIVE NEGATIVE   Ketones, ur NEGATIVE NEGATIVE mg/dL   Protein, ur NEGATIVE NEGATIVE mg/dL   Nitrite NEGATIVE NEGATIVE   Leukocytes,Ua NEGATIVE NEGATIVE   RBC / HPF 0-5 0 - 5 RBC/hpf   WBC, UA 0-5 0 - 5 WBC/hpf   Bacteria, UA NONE SEEN NONE SEEN   Squamous Epithelial / LPF 0-5 0 - 5   Mucus PRESENT      US OB LESS THAN 14 WEEKS WITH OB TRANSVAGINAL  Result Date: 03/26/2022 CLINICAL DATA:  Abdominal pain EXAM: OBSTETRIC <14 WK [place] AND TRANSVAGINAL OB US TECHNIQUE: Both transabdominal and transvaginal ultrasound examinations were performed for complete evaluation of the gestation as well as the maternal uterus, adnexal regions, and pelvic cul-de-sac. Transvaginal technique was performed to assess early pregnancy. COMPARISON:  None Available. FINDINGS: Intrauterine gestational sac: Single Yolk sac:  Visualized. Embryo:  Visualized. Cardiac Activity: Visualized. Heart Rate: 161 bpm CRL:  60.7 mm   12 w   4 d                  [place] EDC: 10/04/2022 Subchorionic hemorrhage: Small to moderate subchorionic hemorrhage, measuring 3.7 x 0.8 x 2.5 cm anteriorly in the lower uterine segment Maternal uterus/adnexae: Unremarkable. IMPRESSION: 1. Single intrauterine gestation at sonographic gestational age of [redacted] weeks, 4 days. Fetal heart rate 161 beats per minute. EDD 10/04/2022.  2. Small to moderate subchorionic hemorrhage anteriorly in the lower uterine segment. Electronically Signed   By: Delanna Ahmadi M.D.   On: 03/26/2022 19:32     MDM Prenatal records from community office reviewed. Pregnancy complicated by uncomplicated Labs ordered and reviewed.   UA CBC, HCG ABO/Rh- O Pos  CNM independently reviewed the imaging ordered. Imaging show live IUP consistent with dates and subchorionic hemorrhage.   Reviewed results of subchorionic hemorrhage with patient and partner. Discussed that this is a common finding in the first trimester and does not usually cause problems in the pregnancy like loss or difficulty with development. Reviewed expectations for vaginal bleeding including a small amount possibly for several weeks. Reviewed warning signs of heavy bleeding, saturating a pad in less than an hour, and severe pain as reasons to come back to MAU. Encouraged patient to exercise pelvic rest until 7 days after bleeding stops. Patient and support person verbalized understanding.   Assessment and Plan   1. Subchorionic hemorrhage of placenta in second trimester, single or unspecified fetus   2. [redacted] weeks gestation of pregnancy   3. Vaginal bleeding affecting early pregnancy    -Discharge home in stable condition -Vaginal bleeding precautions discussed -Patient advised to follow-up with OB as scheduled for prenatal care tomorrow -Patient may return to MAU as needed or if her condition were to change or worsen  Angelita Ingles  Maryruth Hancock, CNM 03/26/2022, 7:40 PM

## 2022-03-26 NOTE — Discharge Instructions (Signed)

## 2022-03-26 NOTE — MAU Note (Signed)
.  Jordan Jackson is a 26 y.o. at [redacted]w[redacted]d here in MAU reporting: Pt reports she woke up at 1100 she felt sharp abdominal pain. It went away and she went back to sleep until 1600. Pt reports gush of fluid. She went to bathroom and the toilet was full of blood.   Onset of complaint: today 1100 Pain score: denies There were no vitals filed for this visit.    Lab orders placed from triage:   ua

## 2022-04-28 ENCOUNTER — Encounter (HOSPITAL_COMMUNITY): Payer: Self-pay | Admitting: Obstetrics and Gynecology

## 2022-04-28 ENCOUNTER — Encounter: Payer: Self-pay | Admitting: Physician Assistant

## 2022-04-28 ENCOUNTER — Other Ambulatory Visit: Payer: Self-pay

## 2022-04-28 ENCOUNTER — Inpatient Hospital Stay (HOSPITAL_COMMUNITY)
Admission: AD | Admit: 2022-04-28 | Discharge: 2022-04-28 | Disposition: A | Discharge status: Home / Self Care | Payer: No Typology Code available for payment source | Attending: Obstetrics and Gynecology | Admitting: Obstetrics and Gynecology

## 2022-04-28 DIAGNOSIS — M7918 Myalgia, other site: Secondary | ICD-10-CM | POA: Diagnosis not present

## 2022-04-28 DIAGNOSIS — R319 Hematuria, unspecified: Secondary | ICD-10-CM | POA: Insufficient documentation

## 2022-04-28 DIAGNOSIS — O99891 Other specified diseases and conditions complicating pregnancy: Secondary | ICD-10-CM

## 2022-04-28 DIAGNOSIS — Z3A17 17 weeks gestation of pregnancy: Secondary | ICD-10-CM | POA: Insufficient documentation

## 2022-04-28 DIAGNOSIS — O26892 Other specified pregnancy related conditions, second trimester: Secondary | ICD-10-CM | POA: Diagnosis present

## 2022-04-28 DIAGNOSIS — O26712 Subluxation of symphysis (pubis) in pregnancy, second trimester: Secondary | ICD-10-CM | POA: Diagnosis not present

## 2022-04-28 DIAGNOSIS — R102 Pelvic and perineal pain: Secondary | ICD-10-CM | POA: Insufficient documentation

## 2022-04-28 LAB — URINALYSIS, ROUTINE W REFLEX MICROSCOPIC
Bilirubin Urine: NEGATIVE
Glucose, UA: NEGATIVE mg/dL
Ketones, ur: NEGATIVE mg/dL
Leukocytes,Ua: NEGATIVE
Nitrite: NEGATIVE
Protein, ur: NEGATIVE mg/dL
Specific Gravity, Urine: 1.027 (ref 1.005–1.030)
pH: 5 (ref 5.0–8.0)

## 2022-04-28 NOTE — MAU Provider Note (Addendum)
History     CSN: [id number]  Arrival date and time: 04/28/22 0746   Event Date/Time   First Provider Initiated Contact with Patient 04/28/22 [phone]      Chief Complaint  Patient presents with   Pelvic Pain   HPI  Jordan Jackson is a 26 y.o. G1P0000 at [redacted]w[redacted]d who presents for evaluation of pelvic pain. Patient reports pain for the last 90 minutes in the center of her pelvis. She reports it is worse when she walks and ok when at rest. She reports it feels like a tearing sensation. Patient rates the pain as a 4/10 and has not tried anything for the pain. She also reports intermittent sharp pain in her vagina that comes and goes at random. She denies any vaginal bleeding, discharge, and leaking of fluid. Denies any constipation, diarrhea or any urinary complaints.  OB History     Gravida  1   Para  0   Term  0   Preterm  0   AB  0   Living  0      SAB  0   IAB  0   Ectopic  0   Multiple  0   Live Births              Past Medical History:  Diagnosis Date   Fatty liver 10/24/2016   Refer to GI   Gastritis    easinophilic    GERD (gastroesophageal reflux disease)    Irregular periods/menstrual cycles 08/10/2015   Menstrual cramps 08/10/2015   Pelvic pain in female     Past Surgical History:  Procedure Laterality Date   BIOPSY  12/26/2016   Procedure: BIOPSY;  Surgeon: Danie Binder, MD;  [facility];  Service: Endoscopy;;  gastric duodenal   COLONOSCOPY N/A 12/26/2016   Procedure: COLONOSCOPY;  Surgeon: Danie Binder, MD;  [facility];  Service: Endoscopy;  Laterality: N/A;  1:00pm   ESOPHAGOGASTRODUODENOSCOPY N/A 12/26/2016   Procedure: ESOPHAGOGASTRODUODENOSCOPY (EGD);  Surgeon: Danie Binder, MD;  [facility];  Service: Endoscopy;  Laterality: N/A;   NO PAST SURGERIES      Family History  Problem Relation Age of Onset   Arthritis Mother    High blood pressure Mother    Hypertension Father    Diabetes  Maternal Grandmother    Hypertension Paternal Grandmother    Hypertension Paternal Grandfather    Diabetes Paternal Grandfather    Inflammatory bowel disease Neg Hx    Liver disease Neg Hx    Celiac disease Neg Hx    Colon cancer Neg Hx     Social History   Tobacco Use   Smoking status: Never   Smokeless tobacco: Never  Vaping Use   Vaping Use: Never used  Substance Use Topics   Alcohol use: Not Currently   Drug use: No    Allergies: No Known Allergies  Medications Prior to Admission  Medication Sig Dispense Refill Last Dose   Prenat-FeFum-FePo-FA-Omega 3 (TARON-C DHA) 35-1 MG CAPS Take 1 capsule by mouth daily. 30 capsule 5 04/27/2022   clindamycin (CLEOCIN T) 1 % external solution Apply to affected areas in the morning (Patient not taking: Reported on 04/28/2022) 60 mL PRN Not Taking   Fluocinolone Acetonide Scalp (DERMA-SMOOTHE/FS SCALP) 0.01 % OIL apply to affected area at bedtime and wash off upon waking (Patient not taking: Reported on 04/28/2022) 118.28 mL 3 Not Taking   pantoprazole (PROTONIX) 40 MG tablet Take 1  tablet (40 mg total) by mouth daily. 30 tablet 0     Review of Systems  Constitutional: Negative.  Negative for fatigue and fever.  HENT: Negative.    Respiratory: Negative.  Negative for shortness of breath.   Cardiovascular: Negative.  Negative for chest pain.  Gastrointestinal:  Negative for abdominal pain, constipation, diarrhea, nausea and vomiting.  Genitourinary:  Positive for pelvic pain and vaginal pain. Negative for dysuria, vaginal bleeding and vaginal discharge.  Neurological: Negative.  Negative for dizziness and headaches.   Physical Exam   Blood pressure 124/80, pulse 97, temperature 98.6 F (37 C), temperature source Oral, resp. rate 16, height 5\' 3"  (1.6 m), weight 104.9 kg, SpO2 99 %.  Patient Vitals for the past 24 hrs:  BP Temp Temp src Pulse Resp SpO2 Height Weight  04/28/22 0802 124/80 98.6 F (37 C) Oral 97 16 99 % -- --  04/28/22  0757 -- -- -- -- -- -- 5\' 3"  (1.6 m) 104.9 kg    Physical Exam Vitals and nursing note reviewed.  Constitutional:      General: She is not in acute distress.    Appearance: She is well-developed.  HENT:     Head: Normocephalic.  Eyes:     Pupils: Pupils are equal, round, and reactive to light.  Cardiovascular:     Rate and Rhythm: Normal rate and regular rhythm.     Heart sounds: Normal heart sounds.  Pulmonary:     Effort: Pulmonary effort is normal. No respiratory distress.     Breath sounds: Normal breath sounds.  Abdominal:     General: Bowel sounds are normal. There is no distension.     Palpations: Abdomen is soft.     Tenderness: There is no abdominal tenderness.  Skin:    General: Skin is warm and dry.  Neurological:     Mental Status: She is alert and oriented to person, place, and time.  Psychiatric:        Mood and Affect: Mood normal.        Behavior: Behavior normal.        Thought Content: Thought content normal.        Judgment: Judgment normal.     FHT: 148   Dilation: Closed Effacement (%): Thick Exam by:: c Chey Rachels cnm  MAU Course  Procedures  Results for orders placed or performed during the hospital encounter of 04/28/22 (from the past 24 hour(s))  Urinalysis, Routine w reflex microscopic Urine, Clean Catch     Status: Abnormal   Collection Time: 04/28/22  8:19 AM  Result Value Ref Range   Color, Urine YELLOW YELLOW   APPearance CLEAR CLEAR   Specific Gravity, Urine 1.027 1.005 - 1.030   pH 5.0 5.0 - 8.0   Glucose, UA NEGATIVE NEGATIVE mg/dL   Hgb urine dipstick SMALL (A) NEGATIVE   Bilirubin Urine NEGATIVE NEGATIVE   Ketones, ur NEGATIVE NEGATIVE mg/dL   Protein, ur NEGATIVE NEGATIVE mg/dL   Nitrite NEGATIVE NEGATIVE   Leukocytes,Ua NEGATIVE NEGATIVE   RBC / HPF 0-5 0 - 5 RBC/hpf   WBC, UA 0-5 0 - 5 WBC/hpf   Bacteria, UA RARE (A) NONE SEEN   Squamous Epithelial / LPF 0-5 0 - 5   Mucus PRESENT      MDM Prenatal records from  community office reviewed. Pregnancy complicated by uncomplicated. Labs ordered and reviewed.   UA- hematuria present. Review of chart shows hematuria on UA for greater than 4 years-no new change  Cervix closed, no bleeding on exam  Offered Tyelnol for pain management- patient declines  CNM provided reassurance and reviewed recommendation for pregnancy support belt and good body mechanics.   Assessment and Plan   1. Pain in symphysis pubis during pregnancy   2. [redacted] weeks gestation of pregnancy     -Discharge home in stable condition -Second trimester precautions discussed -Patient advised to follow-up with OB as scheduled for prenatal care -Patient may return to MAU as needed or if her condition were to change or worsen  Wende Mott, CNM 04/28/2022, 8:45 AM

## 2022-04-28 NOTE — Discharge Instructions (Signed)

## 2022-04-28 NOTE — MAU Note (Signed)
Jordan Jackson is a 26 y.o. at [redacted]w[redacted]d here in MAU reporting: for the past 90 minutes has been having sharp pelvic pain. States she thinks it is easing up a little bit now. Pain was making it hard for her to walk. No bleeding, LOF, or discharge.  Onset of complaint: today  Pain score: 4/10  Vitals:   04/28/22 0802  BP: 124/80  Pulse: 97  Resp: 16  Temp: 98.6 F (37 C)  SpO2: 99%     FHT:148  Lab orders placed from triage: UA

## 2022-05-04 ENCOUNTER — Encounter: Payer: Self-pay | Admitting: Physician Assistant

## 2022-05-09 ENCOUNTER — Other Ambulatory Visit: Payer: Self-pay

## 2022-05-10 ENCOUNTER — Other Ambulatory Visit: Payer: Self-pay

## 2022-06-08 ENCOUNTER — Encounter: Payer: Self-pay | Admitting: *Deleted

## 2022-06-26 NOTE — L&D Delivery Note (Signed)
Patient was C/C/+2 and pushed for approximately 40 minutes with epidural.    NSVD  female infant, Apgars 9/9, weight pending.   The patient had 2nd laceration repaired with 2-0 vicryl rapide. Fundus was firm. EBL was expected amount. Placenta was delivered intact. Vagina was clear.  Delayed cord clamping done for 30-60 seconds while warming baby. Baby was vigorous and doing skin to skin with mother.  Allyn Kenner

## 2022-06-30 ENCOUNTER — Inpatient Hospital Stay (HOSPITAL_COMMUNITY)
Admission: AD | Admit: 2022-06-30 | Discharge: 2022-06-30 | Disposition: A | Discharge status: Home / Self Care | Payer: 59 | Attending: Obstetrics and Gynecology | Admitting: Obstetrics and Gynecology

## 2022-06-30 ENCOUNTER — Encounter (HOSPITAL_COMMUNITY): Payer: Self-pay | Admitting: Obstetrics and Gynecology

## 2022-06-30 DIAGNOSIS — Z3A26 26 weeks gestation of pregnancy: Secondary | ICD-10-CM | POA: Diagnosis not present

## 2022-06-30 DIAGNOSIS — O368120 Decreased fetal movements, second trimester, not applicable or unspecified: Secondary | ICD-10-CM

## 2022-06-30 NOTE — MAU Provider Note (Signed)
MAU Provider Note  History  [id number]  Arrival date and time: 06/30/22 0732   Chief Complaint  Patient presents with   Decreased Fetal Movement     HPI Jordan Jackson is a 27 y.o. G1P0000 at [redacted]w[redacted]d by LMP with PMHx notable for none, who presents for Decreased fetal movement.  Patient has a Foley removed since last night.  Felt baby move twice before working on night.  And while she was working last night she felt baby move twice.  Has not tried any sugary drinks.  She recently ate breakfast.  She notes that she now feels baby move a little bit.   Vaginal bleeding: No LOF: No Fetal Movement: Yes Contractions: No  --/--/O POS (10/01 1731)  OB History     Gravida  1   Para  0   Term  0   Preterm  0   AB  0   Living  0      SAB  0   IAB  0   Ectopic  0   Multiple  0   Live Births              Past Medical History:  Diagnosis Date   Fatty liver 10/24/2016   Refer to GI   Gastritis    easinophilic    GERD (gastroesophageal reflux disease)    Irregular periods/menstrual cycles 08/10/2015   Menstrual cramps 08/10/2015   Pelvic pain in female     Past Surgical History:  Procedure Laterality Date   BIOPSY  12/26/2016   Procedure: BIOPSY;  Surgeon: Danie Binder, MD;  [facility];  Service: Endoscopy;;  gastric duodenal   COLONOSCOPY N/A 12/26/2016   Procedure: COLONOSCOPY;  Surgeon: Danie Binder, MD;  [facility];  Service: Endoscopy;  Laterality: N/A;  1:00pm   ESOPHAGOGASTRODUODENOSCOPY N/A 12/26/2016   Procedure: ESOPHAGOGASTRODUODENOSCOPY (EGD);  Surgeon: Danie Binder, MD;  [facility];  Service: Endoscopy;  Laterality: N/A;   NO PAST SURGERIES      Family History  Problem Relation Age of Onset   Hypertension Mother    Arthritis Mother    Hypertension Father    Diabetes Maternal Grandmother    Hypertension Paternal Grandmother    Hypertension Paternal Grandfather    Diabetes Paternal Grandfather     Inflammatory bowel disease Neg Hx    Liver disease Neg Hx    Celiac disease Neg Hx    Colon cancer Neg Hx     Social History   Socioeconomic History   Marital status: Married    Spouse name: Not on file   Number of children: 0   Years of education: Not on file   Highest education level: Not on file  Occupational History   Occupation: Chartered certified accountant   Occupation: RN  Tobacco Use   Smoking status: Never   Smokeless tobacco: Never  Vaping Use   Vaping Use: Never used  Substance and Sexual Activity   Alcohol use: Not Currently   Drug use: No   Sexual activity: Yes    Birth control/protection: None  Other Topics Concern   Not on file  Social History Narrative   RN for NICU   Night shifts   Live with husband   Social Determinants of Health   Financial Resource Strain: Not on file  Food Insecurity: Not on file  Transportation Needs: Not on file  Physical Activity: Not on file  Stress: Not on file  Social  Connections: Not on file  Intimate Partner Violence: Not on file    No Known Allergies  No current facility-administered medications on file prior to encounter.   Current Outpatient Medications on File Prior to Encounter  Medication Sig Dispense Refill   Prenat-FeFum-FePo-FA-Omega 3 (TARON-C DHA) 35-1 MG CAPS Take 1 capsule by mouth daily. 30 capsule 5   clindamycin (CLEOCIN T) 1 % external solution Apply to affected areas in the morning (Patient not taking: Reported on 04/28/2022) 60 mL PRN   Fluocinolone Acetonide Scalp (DERMA-SMOOTHE/FS SCALP) 0.01 % OIL apply to affected area at bedtime and wash off upon waking (Patient not taking: Reported on 04/28/2022) 118.28 mL 3   pantoprazole (PROTONIX) 40 MG tablet Take 1 tablet (40 mg total) by mouth daily. 30 tablet 0     Review of Systems  Constitutional:  Negative for appetite change, chills and fever.  HENT:  Negative for congestion, facial swelling and postnasal drip.   Eyes:  Negative for photophobia.   Respiratory:  Negative for cough and shortness of breath.   Cardiovascular:  Negative for chest pain.  Gastrointestinal:  Negative for abdominal pain, nausea and vomiting.  Endocrine: Negative for polyuria.  Genitourinary:  Negative for flank pain and pelvic pain.  Musculoskeletal:  Negative for arthralgias.  Skin:  Negative for rash.  Neurological:  Negative for weakness and light-headedness.  Psychiatric/Behavioral:  Negative for confusion.     Pertinent positives and negative per HPI, all others reviewed and negative  Physical Exam   BP 126/80 (BP Location: Right Arm)   Pulse (!) 106   Temp 98.7 F (37.1 C)   Resp 20   Ht 5\' 3"  (1.6 m)   Wt 111.6 kg   LMP  (Exact Date)   SpO2 97%   BMI 43.58 kg/m   Patient Vitals for the past 24 hrs:  BP Temp Pulse Resp SpO2 Height Weight  06/30/22 0802 126/80 -- (!) 106 20 97 % -- --  06/30/22 0743 136/79 98.7 F (37.1 C) (!) 104 18 -- 5\' 3"  (1.6 m) 111.6 kg    Physical Exam Vitals reviewed.  Constitutional:      Appearance: Normal appearance.  HENT:     Head: Normocephalic and atraumatic.     Right Ear: External ear normal.     Left Ear: External ear normal.  Cardiovascular:     Rate and Rhythm: Normal rate and regular rhythm.  Pulmonary:     Effort: Pulmonary effort is normal.     Breath sounds: Normal breath sounds.  Abdominal:     General: Abdomen is flat.     Palpations: Abdomen is soft.  Skin:    General: Skin is warm.     Capillary Refill: Capillary refill takes less than 2 seconds.  Psychiatric:        Mood and Affect: Mood normal.     Cervical Exam    Bedside Ultrasound done Pt informed that the ultrasound is considered a limited OB ultrasound and is not intended to be a complete ultrasound exam.  Patient also informed that the ultrasound is not being completed with the intent of assessing for fetal or placental anomalies or any pelvic abnormalities.  Explained that the purpose of today's ultrasound is to  assess for  BPP and viability.  Patient acknowledges the purpose of the exam and the limitations of the study.    Findings: FHT 135, noted limb movements, normal AFI  FHT Baseline 135 , moderate variability, 15 x 15accels, no decels  Toco: Irritability Cat: 1  Labs No results found for this or any previous visit (from the past 24 hour(s)).  Imaging No results found.  MAU Course  Procedures Lab Orders  No laboratory test(s) ordered today   No orders of the defined types were placed in this encounter.  Imaging Orders  No imaging studies ordered today    MDM moderate  Assessment and Plan  Decreased fetal movement [redacted] weeks gestation Fetal Well being As above Discussed patient with RN. NST reviewed.   Patient presents for decreased fetal movement since 1 day ago.  Patient felt 1 or 2 movements throughout the night.  She recently ate breakfast.  During encounter patient did feel movement.  Bedside ultrasound noted FHT 135 bpm, limb movements and normal AFI.  NST category 1. Gave return and preterm labor precautions.  Follow-up with primary OB.  Dispo: discharged to home in stable condition.   Discharge Instructions     Discharge patient   Complete by: As directed    Discharge disposition: 01-Home or Self Care   Discharge patient date: 06/30/2022      Allergies as of 06/30/2022   No Known Allergies      Medication List     TAKE these medications    clindamycin 1 % external solution Commonly known as: CLEOCIN T Apply to affected areas in the morning   Fluocinolone Acetonide Scalp 0.01 % Oil Commonly known as: Derma-Smoothe/FS Scalp apply to affected area at bedtime and wash off upon waking   pantoprazole 40 MG tablet Commonly known as: PROTONIX Take 1 tablet (40 mg total) by mouth daily.   Taron-C DHA 35-1 MG Caps Take 1 capsule by mouth daily.        Shelda Pal, [place] Fellow, Faculty practice [place] for [facility] 06/30/22  8:27 AM

## 2022-06-30 NOTE — MAU Note (Signed)
.  Jordan Jackson is a 27 y.o. at [redacted]w[redacted]d here in MAU reporting: has not felt baby move much since yesterday. Denies any pain or cramping and no vag bleeding or leaking reported.   Onset of complaint: yesterday Pain score: 0 Vitals:   06/30/22 0743  BP: 136/79  Pulse: (!) 104  Resp: 18  Temp: 98.7 F (37.1 C)     FHT:136 Lab orders placed from triage:

## 2022-07-10 DIAGNOSIS — O139 Gestational [pregnancy-induced] hypertension without significant proteinuria, unspecified trimester: Secondary | ICD-10-CM | POA: Diagnosis not present

## 2022-07-10 DIAGNOSIS — Z3689 Encounter for other specified antenatal screening: Secondary | ICD-10-CM | POA: Diagnosis not present

## 2022-07-13 DIAGNOSIS — R7309 Other abnormal glucose: Secondary | ICD-10-CM | POA: Diagnosis not present

## 2022-07-26 ENCOUNTER — Other Ambulatory Visit: Payer: Self-pay

## 2022-07-27 ENCOUNTER — Other Ambulatory Visit: Payer: Self-pay

## 2022-08-10 ENCOUNTER — Other Ambulatory Visit: Payer: Self-pay

## 2022-08-10 MED ORDER — PANTOPRAZOLE SODIUM 40 MG PO TBEC
40.0000 mg | DELAYED_RELEASE_TABLET | Freq: Every day | ORAL | 1 refills | Status: DC
  Filled 2022-08-10: qty 30, 30d supply, fill #0

## 2022-08-18 ENCOUNTER — Encounter (HOSPITAL_COMMUNITY): Payer: Self-pay | Admitting: Obstetrics and Gynecology

## 2022-08-18 ENCOUNTER — Inpatient Hospital Stay (HOSPITAL_COMMUNITY)
Admission: AD | Admit: 2022-08-18 | Discharge: 2022-08-18 | Disposition: A | Discharge status: Home / Self Care | Payer: 59 | Attending: Obstetrics and Gynecology | Admitting: Obstetrics and Gynecology

## 2022-08-18 ENCOUNTER — Inpatient Hospital Stay (HOSPITAL_BASED_OUTPATIENT_CLINIC_OR_DEPARTMENT_OTHER): Payer: 59

## 2022-08-18 DIAGNOSIS — O368130 Decreased fetal movements, third trimester, not applicable or unspecified: Secondary | ICD-10-CM

## 2022-08-18 DIAGNOSIS — O163 Unspecified maternal hypertension, third trimester: Secondary | ICD-10-CM | POA: Diagnosis not present

## 2022-08-18 DIAGNOSIS — Z79899 Other long term (current) drug therapy: Secondary | ICD-10-CM | POA: Diagnosis not present

## 2022-08-18 DIAGNOSIS — Z3A33 33 weeks gestation of pregnancy: Secondary | ICD-10-CM

## 2022-08-18 DIAGNOSIS — O368390 Maternal care for abnormalities of the fetal heart rate or rhythm, unspecified trimester, not applicable or unspecified: Secondary | ICD-10-CM | POA: Diagnosis not present

## 2022-08-18 DIAGNOSIS — O10913 Unspecified pre-existing hypertension complicating pregnancy, third trimester: Secondary | ICD-10-CM | POA: Insufficient documentation

## 2022-08-18 LAB — CBC
HCT: 36 % (ref 36.0–46.0)
Hemoglobin: 12.6 g/dL (ref 12.0–15.0)
MCH: 30.4 pg (ref 26.0–34.0)
MCHC: 35 g/dL (ref 30.0–36.0)
MCV: 87 fL (ref 80.0–100.0)
Platelets: 251 10*3/uL (ref 150–400)
RBC: 4.14 MIL/uL (ref 3.87–5.11)
RDW: 13.6 % (ref 11.5–15.5)
WBC: 10.4 10*3/uL (ref 4.0–10.5)
nRBC: 0 % (ref 0.0–0.2)

## 2022-08-18 LAB — COMPREHENSIVE METABOLIC PANEL
ALT: 15 U/L (ref 0–44)
AST: 20 U/L (ref 15–41)
Albumin: 2.8 g/dL — ABNORMAL LOW (ref 3.5–5.0)
Alkaline Phosphatase: 78 U/L (ref 38–126)
Anion gap: 10 (ref 5–15)
BUN: <5 mg/dL — ABNORMAL LOW (ref 6–20)
CO2: 20 mmol/L — ABNORMAL LOW (ref 22–32)
Calcium: 9 mg/dL (ref 8.9–10.3)
Chloride: 105 mmol/L (ref 98–111)
Creatinine, Ser: 0.56 mg/dL (ref 0.44–1.00)
GFR, Estimated: >60 mL/min (ref >60)
Glucose, Bld: 89 mg/dL (ref 70–99)
Potassium: 3.7 mmol/L (ref 3.5–5.1)
Sodium: 135 mmol/L (ref 135–145)
Total Bilirubin: 0.4 mg/dL (ref 0.3–1.2)
Total Protein: 6.6 g/dL (ref 6.5–8.1)

## 2022-08-18 LAB — PROTEIN / CREATININE RATIO, URINE
Creatinine, Urine: 139 mg/dL
Protein Creatinine Ratio: 0.13 mg/mg{Cre} (ref 0.00–0.15)
Total Protein, Urine: 18 mg/dL

## 2022-08-18 NOTE — MAU Provider Note (Signed)
Chief Complaint:  Decreased Fetal Movement   Event Date/Time   First Provider Initiated Contact with Patient 08/18/22 0048     HPI: Jordan Jackson is a 27 y.o. G1P0000 at 69w0dho presents to maternity admissions reporting Decreased fetal movement since earlier this evening.  Denies other symptoms. Works in [employer], [employer] She denies LOF, vaginal bleeding, h/a, dizziness, or fever/chills.  She denies headache, visual changes or RUQ abdominal pain.  Hypertension This is a recurrent problem. The current episode started more than 1 month ago. The problem has been waxing and waning since onset. Pertinent negatives include no blurred vision, chest pain or headaches. There are no associated agents to hypertension. There are no known risk factors for coronary artery disease. Past treatments include nothing. There are no compliance problems.    RN Note: Jordan DUSSEAULTis a 27y.o. at 338w0dere in MAU reporting: DFM since 1700 yesterday. Pt states her baby is normally active when she comes to work but she hasn't felt her baby move since around 1700.  Pt denies VB or LOF.   Onset of complaint: 08/17/2022 1700   Pain score: none  Past Medical History: Past Medical History:  Diagnosis Date   Fatty liver 10/24/2016   Refer to GI   Gastritis    easinophilic    GERD (gastroesophageal reflux disease)    Irregular periods/menstrual cycles 08/10/2015   Menstrual cramps 08/10/2015   Pelvic pain in female     Past obstetric history: OB History  Gravida Para Term Preterm AB Living  1 0 0 0 0 0  SAB IAB Ectopic Multiple Live Births  0 0 0 0      # Outcome Date GA Lbr Len/2nd Weight Sex Delivery Anes PTL Lv  1 Current             Past Surgical History: Past Surgical History:  Procedure Laterality Date   BIOPSY  12/26/2016   Procedure: BIOPSY;  Surgeon: FiDanie BinderMD;  [facility];  Service: Endoscopy;;  gastric duodenal   COLONOSCOPY N/A 12/26/2016   Procedure: COLONOSCOPY;  Surgeon:  FiDanie BinderMD;  [facility];  Service: Endoscopy;  Laterality: N/A;  1:00pm   ESOPHAGOGASTRODUODENOSCOPY N/A 12/26/2016   Procedure: ESOPHAGOGASTRODUODENOSCOPY (EGD);  Surgeon: FiDanie BinderMD;  [facility];  Service: Endoscopy;  Laterality: N/A;   NO PAST SURGERIES      Family History: Family History  Problem Relation Age of Onset   Hypertension Mother    Arthritis Mother    Hypertension Father    Diabetes Maternal Grandmother    Hypertension Paternal Grandmother    Hypertension Paternal Grandfather    Diabetes Paternal Grandfather    Inflammatory bowel disease Neg Hx    Liver disease Neg Hx    Celiac disease Neg Hx    Colon cancer Neg Hx     Social History: Social History   Tobacco Use   Smoking status: Never   Smokeless tobacco: Never  Vaping Use   Vaping Use: Never used  Substance Use Topics   Alcohol use: Not Currently   Drug use: No    Allergies: No Known Allergies  Meds:  Medications Prior to Admission  Medication Sig Dispense Refill Last Dose   pantoprazole (PROTONIX) 40 MG tablet Take 1 tablet (40 mg total) by mouth daily. 30 tablet 1 08/17/2022 at 0830   Prenat-FeFum-FePo-FA-Omega 3 (TARON-C DHA) 35-1 MG CAPS Take 1 capsule by mouth daily. 30 capsule  5 08/17/2022 at 1600   clindamycin (CLEOCIN T) 1 % external solution Apply to affected areas in the morning (Patient not taking: Reported on 04/28/2022) 60 mL PRN    Fluocinolone Acetonide Scalp (DERMA-SMOOTHE/FS SCALP) 0.01 % OIL apply to affected area at bedtime and wash off upon waking (Patient not taking: Reported on 04/28/2022) 118.28 mL 3    pantoprazole (PROTONIX) 40 MG tablet Take 1 tablet (40 mg total) by mouth daily. 30 tablet 0     I have reviewed patient's Past Medical Hx, Surgical Hx, Family Hx, Social Hx, medications and allergies.   ROS:  Review of Systems  Eyes:  Negative for blurred vision.  Cardiovascular:  Negative for chest pain.  Neurological:  Negative for  headaches.   Other systems negative  Physical Exam  Patient Vitals for the past 24 hrs:  BP Temp Temp src Pulse Resp SpO2 Height Weight  08/18/22 0043 (!) 143/86 -- -- (!) 109 -- 98 % -- --  08/18/22 0040 (!) 142/83 98.2 F (36.8 C) Oral (!) 112 16 98 % '5\' 3"'$  (1.6 m) 109.3 kg   Vitals:   08/18/22 0115 08/18/22 0120 08/18/22 0125 08/18/22 0130  BP: 121/72     Pulse: (!) 116     Resp:      Temp:      TempSrc:      SpO2: 98% 98% 99% 99%  Weight:      Height:        Constitutional: Well-developed, well-nourished female in no acute distress.  Cardiovascular: normal rate  Respiratory: normal effort GI: Abd soft, non-tender, gravid appropriate for gestational age.   No rebound or guarding. MS: Extremities nontender, no edema, normal ROM Neurologic: Alert and oriented x 4.   DTRs 2+ GU: Neg CVAT.    FHT:  Baseline 140 , moderate variability, accelerations present, no decelerations Contractions: occasional   Labs: Results for orders placed or performed during the hospital encounter of 08/18/22 (from the past 24 hour(s))  Protein / creatinine ratio, urine     Status: None   Collection Time: 08/18/22  1:05 AM  Result Value Ref Range   Creatinine, Urine 139 mg/dL   Total Protein, Urine 18 mg/dL   Protein Creatinine Ratio 0.13 0.00 - 0.15 mg/mg[Cre]  CBC     Status: None   Collection Time: 08/18/22  1:23 AM  Result Value Ref Range   WBC 10.4 4.0 - 10.5 K/uL   RBC 4.14 3.87 - 5.11 MIL/uL   Hemoglobin 12.6 12.0 - 15.0 g/dL   HCT 36.0 36.0 - 46.0 %   MCV 87.0 80.0 - 100.0 fL   MCH 30.4 26.0 - 34.0 pg   MCHC 35.0 30.0 - 36.0 g/dL   RDW 13.6 11.5 - 15.5 %   Platelets 251 150 - 400 K/uL   nRBC 0.0 0.0 - 0.2 %  Comprehensive metabolic panel     Status: Abnormal   Collection Time: 08/18/22  1:23 AM  Result Value Ref Range   Sodium 135 135 - 145 mmol/L   Potassium 3.7 3.5 - 5.1 mmol/L   Chloride 105 98 - 111 mmol/L   CO2 20 (L) 22 - 32 mmol/L   Glucose, Bld 89 70 - 99 mg/dL    BUN <5 (L) 6 - 20 mg/dL   Creatinine, Ser 0.56 0.44 - 1.00 mg/dL   Calcium 9.0 8.9 - 10.3 mg/dL   Total Protein 6.6 6.5 - 8.1 g/dL   Albumin 2.8 (L) 3.5 - 5.0 g/dL  AST 20 15 - 41 U/L   ALT 15 0 - 44 U/L   Alkaline Phosphatase 78 38 - 126 U/L   Total Bilirubin 0.4 0.3 - 1.2 mg/dL   GFR, Estimated >60 >60 mL/min   Anion gap 10 5 - 15    --/--/O POS (10/01 1731)  Imaging:  No results found.  MAU Course/MDM: I have reviewed the triage vital signs and the nursing notes.   Pertinent labs & imaging results that were available during my care of the patient were reviewed by me and considered in my medical decision making (see chart for details).      I have reviewed her medical records including past results, notes and treatments.   I have ordered labs to evaluate hypertension and reviewed results. These are normal  NST reviewed, reactive with two small variable decels so BPP ordered.  It was 8/8   Treatments in MAU included NST, BPP.    Assessment: Single IUP at 72w0dDecreased fetal movement Reassuring fetal testing Probable chronic hypertension in pregnancy, labs normal   Plan: Discharge home Fetal movement monitoring Preeclampsia precautions Preterm Labor precautions and fetal kick counts Follow up in Office for prenatal visits and recheck Encouraged to return if she develops worsening of symptoms, increase in pain, fever, or other concerning symptoms.   Pt stable at time of discharge.  MHansel FeinsteinCNM, MSN Certified Nurse-Midwife 08/18/2022 12:48 AM

## 2022-08-18 NOTE — MAU Note (Signed)
Jordan Jackson is a 27 y.o. at 61w0dhere in MAU reporting: DFM since 1700 yesterday. Pt states her baby is normally active when she comes to work but she hasn't felt her baby move since around 1700.  Pt denies VB or LOF.   Onset of complaint: 08/17/2022 1700 Pain score: none Vitals:   08/18/22 0040  BP: (!) 142/83  Pulse: (!) 112  Resp: 16  Temp: 98.2 F (36.8 C)  SpO2: 98%     FHT:132 Lab orders placed from triage:  None

## 2022-09-12 DIAGNOSIS — Z3403 Encounter for supervision of normal first pregnancy, third trimester: Secondary | ICD-10-CM | POA: Diagnosis not present

## 2022-09-12 DIAGNOSIS — O26849 Uterine size-date discrepancy, unspecified trimester: Secondary | ICD-10-CM | POA: Diagnosis not present

## 2022-09-12 DIAGNOSIS — R030 Elevated blood-pressure reading, without diagnosis of hypertension: Secondary | ICD-10-CM | POA: Diagnosis not present

## 2022-09-12 DIAGNOSIS — Z3685 Encounter for antenatal screening for Streptococcus B: Secondary | ICD-10-CM | POA: Diagnosis not present

## 2022-09-12 DIAGNOSIS — Z3A36 36 weeks gestation of pregnancy: Secondary | ICD-10-CM | POA: Diagnosis not present

## 2022-09-14 ENCOUNTER — Other Ambulatory Visit: Payer: Self-pay | Admitting: Obstetrics and Gynecology

## 2022-09-14 DIAGNOSIS — Z3A36 36 weeks gestation of pregnancy: Secondary | ICD-10-CM | POA: Diagnosis not present

## 2022-09-14 DIAGNOSIS — O133 Gestational [pregnancy-induced] hypertension without significant proteinuria, third trimester: Secondary | ICD-10-CM

## 2022-09-14 DIAGNOSIS — O99213 Obesity complicating pregnancy, third trimester: Secondary | ICD-10-CM | POA: Diagnosis not present

## 2022-09-14 DIAGNOSIS — O139 Gestational [pregnancy-induced] hypertension without significant proteinuria, unspecified trimester: Secondary | ICD-10-CM | POA: Diagnosis not present

## 2022-09-15 ENCOUNTER — Inpatient Hospital Stay (HOSPITAL_COMMUNITY)
Admission: RE | Admit: 2022-09-15 | Discharge: 2022-09-19 | DRG: 807 | Disposition: A | Discharge status: Home / Self Care | Payer: 59 | Attending: Obstetrics and Gynecology | Admitting: Obstetrics and Gynecology

## 2022-09-15 ENCOUNTER — Inpatient Hospital Stay (HOSPITAL_COMMUNITY): Payer: 59

## 2022-09-15 DIAGNOSIS — O1002 Pre-existing essential hypertension complicating childbirth: Secondary | ICD-10-CM | POA: Diagnosis not present

## 2022-09-15 DIAGNOSIS — O99214 Obesity complicating childbirth: Secondary | ICD-10-CM | POA: Diagnosis not present

## 2022-09-15 DIAGNOSIS — O133 Gestational [pregnancy-induced] hypertension without significant proteinuria, third trimester: Secondary | ICD-10-CM | POA: Diagnosis present

## 2022-09-15 DIAGNOSIS — Z3A37 37 weeks gestation of pregnancy: Secondary | ICD-10-CM | POA: Diagnosis not present

## 2022-09-15 DIAGNOSIS — O134 Gestational [pregnancy-induced] hypertension without significant proteinuria, complicating childbirth: Secondary | ICD-10-CM | POA: Diagnosis not present

## 2022-09-15 LAB — CBC
HCT: 34.6 % — ABNORMAL LOW (ref 36.0–46.0)
HCT: 37.9 % (ref 36.0–46.0)
Hemoglobin: 12 g/dL (ref 12.0–15.0)
Hemoglobin: 12.7 g/dL (ref 12.0–15.0)
MCH: 30.2 pg (ref 26.0–34.0)
MCH: 29.5 pg (ref 26.0–34.0)
MCHC: 34.7 g/dL (ref 30.0–36.0)
MCHC: 33.5 g/dL (ref 30.0–36.0)
MCV: 87.2 fL (ref 80.0–100.0)
MCV: 87.9 fL (ref 80.0–100.0)
Platelets: 229 10*3/uL (ref 150–400)
Platelets: 228 10*3/uL (ref 150–400)
RBC: 3.97 MIL/uL (ref 3.87–5.11)
RBC: 4.31 MIL/uL (ref 3.87–5.11)
RDW: 14.1 % (ref 11.5–15.5)
RDW: 14.2 % (ref 11.5–15.5)
WBC: 7.4 10*3/uL (ref 4.0–10.5)
WBC: 9.4 10*3/uL (ref 4.0–10.5)
nRBC: 0 % (ref 0.0–0.2)
nRBC: 0 % (ref 0.0–0.2)

## 2022-09-15 LAB — COMPREHENSIVE METABOLIC PANEL
ALT: 15 U/L (ref 0–44)
AST: 22 U/L (ref 15–41)
Albumin: 2.5 g/dL — ABNORMAL LOW (ref 3.5–5.0)
Alkaline Phosphatase: 89 U/L (ref 38–126)
Anion gap: 7 (ref 5–15)
BUN: <5 mg/dL — ABNORMAL LOW (ref 6–20)
CO2: 21 mmol/L — ABNORMAL LOW (ref 22–32)
Calcium: 8.5 mg/dL — ABNORMAL LOW (ref 8.9–10.3)
Chloride: 106 mmol/L (ref 98–111)
Creatinine, Ser: 0.56 mg/dL (ref 0.44–1.00)
GFR, Estimated: >60 mL/min (ref >60)
Glucose, Bld: 74 mg/dL (ref 70–99)
Potassium: 3.4 mmol/L — ABNORMAL LOW (ref 3.5–5.1)
Sodium: 134 mmol/L — ABNORMAL LOW (ref 135–145)
Total Bilirubin: 0.4 mg/dL (ref 0.3–1.2)
Total Protein: 5.8 g/dL — ABNORMAL LOW (ref 6.5–8.1)

## 2022-09-15 LAB — PROTEIN / CREATININE RATIO, URINE
Creatinine, Urine: 30 mg/dL
Total Protein, Urine: <6 mg/dL

## 2022-09-15 LAB — TYPE AND SCREEN
ABO/RH(D): O POS
Antibody Screen: NEGATIVE

## 2022-09-15 LAB — OB RESULTS CONSOLE GBS: GBS: NEGATIVE

## 2022-09-15 LAB — RPR: RPR Ser Ql: NONREACTIVE

## 2022-09-15 MED ORDER — TERBUTALINE SULFATE 1 MG/ML IJ SOLN: 0.2500 mg | Freq: Once | INTRAMUSCULAR | Status: DC | PRN

## 2022-09-15 MED ORDER — SOD CITRATE-CITRIC ACID 500-334 MG/5ML PO SOLN
30.0000 mL | ORAL | Status: DC | PRN
  Administered 2022-09-16: 30 mL via ORAL
  Filled 2022-09-15: qty 30

## 2022-09-15 MED ORDER — LACTATED RINGERS IV SOLN: 500.0000 mL | INTRAVENOUS | Status: DC | PRN

## 2022-09-15 MED ORDER — OXYTOCIN BOLUS FROM INFUSION
333.0000 mL | Freq: Once | INTRAVENOUS | Status: AC
  Administered 2022-09-17: 333 mL via INTRAVENOUS

## 2022-09-15 MED ORDER — ACETAMINOPHEN 325 MG PO TABS
650.0000 mg | ORAL_TABLET | ORAL | Status: DC | PRN
  Administered 2022-09-15: 650 mg via ORAL
  Filled 2022-09-15: qty 2

## 2022-09-15 MED ORDER — OXYCODONE-ACETAMINOPHEN 5-325 MG PO TABS: 2.0000 | ORAL_TABLET | ORAL | Status: DC | PRN

## 2022-09-15 MED ORDER — OXYTOCIN-SODIUM CHLORIDE 30-0.9 UT/500ML-% IV SOLN
1.0000 m[IU]/min | INTRAVENOUS | Status: DC
  Filled 2022-09-15: qty 500

## 2022-09-15 MED ORDER — MISOPROSTOL 50MCG HALF TABLET
50.0000 ug | ORAL_TABLET | Freq: Once | ORAL | Status: AC
  Administered 2022-09-15: 50 ug via ORAL
  Filled 2022-09-15: qty 1

## 2022-09-15 MED ORDER — ONDANSETRON HCL 4 MG/2ML IJ SOLN
4.0000 mg | Freq: Four times a day (QID) | INTRAMUSCULAR | Status: DC | PRN
  Administered 2022-09-16 – 2022-09-17 (×2): 4 mg via INTRAVENOUS
  Filled 2022-09-15 (×3): qty 2

## 2022-09-15 MED ORDER — OXYCODONE-ACETAMINOPHEN 5-325 MG PO TABS: 1.0000 | ORAL_TABLET | ORAL | Status: DC | PRN

## 2022-09-15 MED ORDER — OXYTOCIN-SODIUM CHLORIDE 30-0.9 UT/500ML-% IV SOLN
2.5000 [IU]/h | INTRAVENOUS | Status: DC
  Administered 2022-09-17: 2.5 [IU]/h via INTRAVENOUS

## 2022-09-15 MED ORDER — LIDOCAINE HCL (PF) 1 % IJ SOLN: 30.0000 mL | INTRAMUSCULAR | Status: DC | PRN

## 2022-09-15 MED ORDER — FENTANYL CITRATE (PF) 100 MCG/2ML IJ SOLN
INTRAMUSCULAR | Status: AC
  Filled 2022-09-15: qty 2

## 2022-09-15 MED ORDER — MISOPROSTOL 25 MCG QUARTER TABLET
25.0000 ug | ORAL_TABLET | Freq: Once | ORAL | Status: AC
  Administered 2022-09-15: 25 ug via VAGINAL
  Filled 2022-09-15: qty 1

## 2022-09-15 MED ORDER — LACTATED RINGERS IV SOLN: INTRAVENOUS | Status: DC

## 2022-09-15 MED ORDER — FENTANYL CITRATE (PF) 100 MCG/2ML IJ SOLN
50.0000 ug | INTRAMUSCULAR | Status: DC | PRN
  Administered 2022-09-15: 50 ug via INTRAVENOUS
  Administered 2022-09-16: 100 ug via INTRAVENOUS
  Filled 2022-09-15: qty 2

## 2022-09-15 NOTE — Progress Notes (Addendum)
Patient ID: Jordan Jackson, female   DOB: [birth year], 27 y.o.   MRN: [id number] Pt feeling mild contractions after they had been stronger  Afeb BP stable FHR category 1 Foley bulb mostly out of cervix, removed with gentle pull   Cervix 3-4/50/-3  Will increase pitocin 2X2 and plan AROM when vertex a bit lower No sever range pressures

## 2022-09-15 NOTE — H&P (Signed)
Jordan Jackson is a 27 y.o. female G1P0 at 45 0/7 weeks (EDD 10/06/22 by 8 week [place] inconsistent with LMP) presenting for IOL for likely chronic  hypertension with exacerbation in third trimester. Patient has had elevated BP intermittently dating back to first trimester, but in last week and two subsequent office visits she has had BP 140-150/90-100 and begun to have intermittent HA's.  Last [place] 09/11/21 AGA 56% 6lbs 11oz, nl afi, post plac, Prenatal care otherwise significant for maternal obesity,  h/o PCOS and mild glucose intolerance, but passed her 3 hour GTT.   OB History     Gravida  1   Para  0   Term  0   Preterm  0   AB  0   Living  0      SAB  0   IAB  0   Ectopic  0   Multiple  0   Live Births             Past Medical History:  Diagnosis Date   Fatty liver 10/24/2016   Refer to GI   Gastritis    easinophilic    GERD (gastroesophageal reflux disease)    Irregular periods/menstrual cycles 08/10/2015   Menstrual cramps 08/10/2015   Pelvic pain in female    Past Surgical History:  Procedure Laterality Date   BIOPSY  12/26/2016   Procedure: BIOPSY;  Surgeon: Jordan Binder, MD;  [facility]: [facility];  Service: Endoscopy;;  gastric duodenal   COLONOSCOPY N/A 12/26/2016   Procedure: COLONOSCOPY;  Surgeon: Jordan Binder, MD;  Location: [facility];  Service: Endoscopy;  Laterality: N/A;  1:00pm   ESOPHAGOGASTRODUODENOSCOPY N/A 12/26/2016   Procedure: ESOPHAGOGASTRODUODENOSCOPY (EGD);  Surgeon: Jordan Binder, MD;  Location: [facility];  Service: Endoscopy;  Laterality: N/A;   NO PAST SURGERIES     Family History: family history includes Arthritis in her mother; Diabetes in her maternal grandmother and paternal grandfather; Hypertension in her father, mother, paternal grandfather, and paternal grandmother. Social History:  reports that she has never smoked. She has never used smokeless tobacco. She reports that she does not currently use alcohol. She  reports that she does not use drugs.     Maternal Diabetes: No Genetic Screening: Normal Maternal Ultrasounds/Referrals: Normal Fetal Ultrasounds or other Referrals:  None Maternal Substance Abuse:  No Significant Maternal Medications:  None Significant Maternal Lab Results:  Group B Strep negative Number of Prenatal Visits:greater than 3 verified prenatal visits Other Comments:  None  Review of Systems Maternal Medical History:  Contractions: Frequency: irregular.   Perceived severity is mild.   Fetal activity: Perceived fetal activity is normal.   Prenatal complications: PIH.   Prenatal Complications - Diabetes: none.   Dilation: Fingertip Station: Ballotable Exam by:: Jordan Jackson Blood pressure 122/81, pulse 93, resp. rate 16, height 5\' 3"  (1.6 m), weight 116.1 kg. Maternal Exam:  Uterine Assessment: Contraction strength is mild.  Contraction frequency is irregular.  Abdomen: Patient reports no abdominal tenderness. Fetal presentation: vertex Introitus: Normal vulva. Normal vagina.    Physical Exam Cardiovascular:     Rate and Rhythm: Normal rate and regular rhythm.  Pulmonary:     Effort: Pulmonary effort is normal.  Abdominal:     Palpations: Abdomen is soft.  Genitourinary:    General: Normal vulva.  Neurological:     Mental Status: She is alert.  Psychiatric:        Mood and Affect: Mood normal.  Prenatal labs: ABO, Rh: --/--/O POS (03/22 1143) Antibody: NEG (03/22 1143) Rubella:  Immune RPR: NON REACTIVE (03/22 1146)  HBsAg:   Neg HIV:   NR GBS:   Neg NIPT low risk, female One hour GCT 156 Three hour GTT WNL Hgb AA  Assessment/Jackson: Pt admitted with likely exacerbation of borderline CHTN.  PIH labs WNL.  She has some intermittent mild HA's.  BP just mildly elevated at present.   Jackson cytotec followed by foley bulb when able. First cytotec given at 18 noon [place] confirms vertex  Jordan Jackson 09/15/2022, 2:45 PM

## 2022-09-15 NOTE — Progress Notes (Signed)
Patient ID: Jordan Jackson, female   DOB: January 21, 1996, 27 y.o.   MRN: [id number] Late entry note  Pt was feeling mild to moderate contractions after first round of cytotec  BP stable  130-140/80-90  Cervix 1/40/ballotable at 1600pm Foley bulb placed with 58mL instilled  Pt to begin on low dose pitocin Will follow progress.

## 2022-09-16 ENCOUNTER — Inpatient Hospital Stay (HOSPITAL_COMMUNITY): Payer: 59 | Admitting: Anesthesiology

## 2022-09-16 LAB — CBC
HCT: 34.7 % — ABNORMAL LOW (ref 36.0–46.0)
Hemoglobin: 11.9 g/dL — ABNORMAL LOW (ref 12.0–15.0)
MCH: 29.9 pg (ref 26.0–34.0)
MCHC: 34.3 g/dL (ref 30.0–36.0)
MCV: 87.2 fL (ref 80.0–100.0)
Platelets: 203 10*3/uL (ref 150–400)
RBC: 3.98 MIL/uL (ref 3.87–5.11)
RDW: 14.2 % (ref 11.5–15.5)
WBC: 9.2 10*3/uL (ref 4.0–10.5)
nRBC: 0 % (ref 0.0–0.2)

## 2022-09-16 MED ORDER — LIDOCAINE HCL (PF) 1 % IJ SOLN
INTRAMUSCULAR | Status: DC | PRN
  Administered 2022-09-16 (×2): 4 mL via EPIDURAL

## 2022-09-16 MED ORDER — EPHEDRINE 5 MG/ML INJ: 10.0000 mg | INTRAVENOUS | Status: DC | PRN

## 2022-09-16 MED ORDER — PHENYLEPHRINE 80 MCG/ML (10ML) SYRINGE FOR IV PUSH (FOR BLOOD PRESSURE SUPPORT): 80.0000 ug | PREFILLED_SYRINGE | INTRAVENOUS | Status: DC | PRN

## 2022-09-16 MED ORDER — LACTATED RINGERS IV SOLN
500.0000 mL | Freq: Once | INTRAVENOUS | Status: AC
  Administered 2022-09-16: 500 mL via INTRAVENOUS

## 2022-09-16 MED ORDER — DIPHENHYDRAMINE HCL 50 MG/ML IJ SOLN: 12.5000 mg | INTRAMUSCULAR | Status: DC | PRN

## 2022-09-16 MED ORDER — LACTATED RINGERS AMNIOINFUSION: INTRAVENOUS | Status: DC

## 2022-09-16 MED ORDER — FENTANYL-BUPIVACAINE-NACL 0.5-0.125-0.9 MG/250ML-% EP SOLN
12.0000 mL/h | EPIDURAL | Status: DC | PRN
  Administered 2022-09-16 (×2): 12 mL/h via EPIDURAL
  Filled 2022-09-16 (×2): qty 250

## 2022-09-16 NOTE — Progress Notes (Signed)
Patient comfortable with epidural FHT: 130 mod var + accels, variable decels noted TOCO: q2-3 SVE: 4/80/-3 A/P: IUPC placed and position change, one contraction without decel, f/b ctx with variable.  If continues will start amnioinfusion 300/150 Continue pitocin and other routine care

## 2022-09-16 NOTE — Progress Notes (Signed)
Patient ID: Jordan Jackson, female   DOB: 1995/07/20, 27 y.o.   MRN: [id number] Pt reports contractions just now becoming a bit more intense  Afeb BP stable   FHR category 1  Will recheck in 1-2 hours and see if able to AROM  Pitocin at 12 mu

## 2022-09-16 NOTE — Progress Notes (Signed)
Patient feeling some back pain, otherwise doing well. FHT Cat 1 TOCO q2-3 SVE: 6/80/-2 per RN A/P: Pt has been on pitocin >24hrs, will do 1 hr pitocin break and restart at half current dose.  Continue IUPC with amnioinfusion.  Monitor and consider cervical check 4-6 hours after restart of pit.

## 2022-09-16 NOTE — Progress Notes (Addendum)
Patient ID: Jordan Jackson, female   DOB: 05-16-1996, 27 y.o.   MRN: [id number]  Pt states contractions still mild for most part  Afeb BP fine   Cervix 70/4/-2 posterior AROM with FSE applied, clear  Pitocin at 14 mu Still in latent phase labor, hopefully AROM will help  FSE applied as FHR difficult to consistently trace with habitus

## 2022-09-16 NOTE — Progress Notes (Signed)
Patient comfortable sitting in high fowlers.  Not feeling pressure.  No other complaints FHT: Cat 1 TOCO q2-3 SVE: deferred A/P: Discussed latent vs active labor with patient and benefit of MVU for assessment of active labor adequacy.  Pt has been ruptured now over 12 hours.  Pitocin @28 .  IUPC with amnioinfusion resulting in resolution of variable noted earlier today.  Discussed timing of cervical checks to limit risk of chorioamnionitis.  RN will recheck around 10pm.  Discussed as long as baby continues to tolerate and no sign of infection will proceed with current plan.

## 2022-09-16 NOTE — Anesthesia Procedure Notes (Signed)
Epidural Patient location during procedure: OB Start time: 09/16/2022 8:27 AM End time: 09/16/2022 8:35 AM  Staffing Anesthesiologist: Brennan Bailey, MD Performed: anesthesiologist   Preanesthetic Checklist Completed: patient identified, IV checked, risks and benefits discussed, monitors and equipment checked, pre-op evaluation and timeout performed  Epidural Patient position: sitting Prep: DuraPrep and site prepped and draped Patient monitoring: continuous pulse ox, blood pressure and heart rate Approach: midline Location: L3-L4 Injection technique: LOR air  Needle:  Needle type: Tuohy  Needle gauge: 17 G Needle length: 9 cm Needle insertion depth: 8 cm Catheter type: closed end flexible Catheter size: 19 Gauge Catheter at skin depth: 13 cm Test dose: negative and Other (1% lidocaine)  Assessment Events: blood not aspirated, no cerebrospinal fluid, injection not painful, no injection resistance, no paresthesia and negative IV test  Additional Notes Patient identified. Risks, benefits, and alternatives discussed with patient including but not limited to bleeding, infection, nerve damage, paralysis, failed block, incomplete pain control, headache, blood pressure changes, nausea, vomiting, reactions to medication, itching, and postpartum back pain. Confirmed with bedside nurse the patient's most recent platelet count. Confirmed with patient that they are not currently taking any anticoagulation, have any bleeding history, or any family history of bleeding disorders. Patient expressed understanding and wished to proceed. All questions were answered. Sterile technique was used throughout the entire procedure. Please see nursing notes for vital signs.   Difficult placement due to habitus, 3 attempts required. Crisp LOR at L3-4. Test dose was given through epidural catheter and negative prior to continuing to dose epidural or start infusion. Warning signs of high block given to the  patient including shortness of breath, tingling/numbness in hands, complete motor block, or any concerning symptoms with instructions to call for help. Patient was given instructions on fall risk and not to get out of bed. All questions and concerns addressed with instructions to call with any issues or inadequate analgesia.  Reason for block:procedure for pain

## 2022-09-16 NOTE — Progress Notes (Signed)
Patient ID: Jordan Jackson, female   DOB: 05/12/1996, 27 y.o.   MRN: [id number] Pt feeling mild/moderate contractions  BP intermittently mildly elevated  FHR category 1  Cervix 4cm/80/-3 posterior  Pitocin at 86mu, will continue to increase until contractions more adequate Vertex still to high for AROM

## 2022-09-16 NOTE — Anesthesia Preprocedure Evaluation (Signed)
Anesthesia Evaluation  Patient identified by MRN, date of birth, ID band Patient awake    Reviewed: Allergy & Precautions, Patient's Chart, lab work & pertinent test results  History of Anesthesia Complications Negative for: history of anesthetic complications  Airway Mallampati: II  TM Distance: >3 FB Neck ROM: Full    Dental no notable dental hx.    Pulmonary neg pulmonary ROS   Pulmonary exam normal        Cardiovascular hypertension (gestational), Normal cardiovascular exam     Neuro/Psych negative neurological ROS  negative psych ROS   GI/Hepatic Neg liver ROS,GERD  ,,  Endo/Other    Morbid obesity (BMI 45)  Renal/GU negative Renal ROS  negative genitourinary   Musculoskeletal negative musculoskeletal ROS (+)    Abdominal   Peds  Hematology negative hematology ROS (+)   Anesthesia Other Findings Day of surgery medications reviewed with patient.  Reproductive/Obstetrics (+) Pregnancy                              Anesthesia Physical Anesthesia Plan  ASA: 3  Anesthesia Plan: Epidural   Post-op Pain Management:    Induction:   PONV Risk Score and Plan: Treatment may vary due to age or medical condition  Airway Management Planned: Natural Airway  Additional Equipment: Fetal Monitoring  Intra-op Plan:   Post-operative Plan:   Informed Consent: I have reviewed the patients History and Physical, chart, labs and discussed the procedure including the risks, benefits and alternatives for the proposed anesthesia with the patient or authorized representative who has indicated his/her understanding and acceptance.       Plan Discussed with:   Anesthesia Plan Comments:          Anesthesia Quick Evaluation

## 2022-09-17 ENCOUNTER — Other Ambulatory Visit: Payer: Self-pay

## 2022-09-17 ENCOUNTER — Encounter (HOSPITAL_COMMUNITY): Payer: Self-pay | Admitting: Obstetrics and Gynecology

## 2022-09-17 LAB — CBC
HCT: 34.3 % — ABNORMAL LOW (ref 36.0–46.0)
Hemoglobin: 11.9 g/dL — ABNORMAL LOW (ref 12.0–15.0)
MCH: 30.4 pg (ref 26.0–34.0)
MCHC: 34.7 g/dL (ref 30.0–36.0)
MCV: 87.7 fL (ref 80.0–100.0)
Platelets: 206 10*3/uL (ref 150–400)
RBC: 3.91 MIL/uL (ref 3.87–5.11)
RDW: 14.3 % (ref 11.5–15.5)
WBC: 17.3 10*3/uL — ABNORMAL HIGH (ref 4.0–10.5)
nRBC: 0 % (ref 0.0–0.2)

## 2022-09-17 MED ORDER — IBUPROFEN 600 MG PO TABS
600.0000 mg | ORAL_TABLET | Freq: Four times a day (QID) | ORAL | Status: DC
  Administered 2022-09-17 – 2022-09-19 (×8): 600 mg via ORAL
  Filled 2022-09-17 (×8): qty 1

## 2022-09-17 MED ORDER — BENZOCAINE-MENTHOL 20-0.5 % EX AERO
1.0000 | INHALATION_SPRAY | CUTANEOUS | Status: DC | PRN
  Filled 2022-09-17: qty 56

## 2022-09-17 MED ORDER — DIBUCAINE (PERIANAL) 1 % EX OINT: 1.0000 | TOPICAL_OINTMENT | CUTANEOUS | Status: DC | PRN

## 2022-09-17 MED ORDER — WITCH HAZEL-GLYCERIN EX PADS: 1.0000 | MEDICATED_PAD | CUTANEOUS | Status: DC | PRN

## 2022-09-17 MED ORDER — ACETAMINOPHEN 325 MG PO TABS: 650.0000 mg | ORAL_TABLET | ORAL | Status: DC | PRN

## 2022-09-17 MED ORDER — SENNOSIDES-DOCUSATE SODIUM 8.6-50 MG PO TABS
2.0000 | ORAL_TABLET | Freq: Every day | ORAL | Status: DC
  Administered 2022-09-18: 2 via ORAL
  Filled 2022-09-17: qty 2

## 2022-09-17 MED ORDER — OXYCODONE HCL 5 MG PO TABS: 10.0000 mg | ORAL_TABLET | ORAL | Status: DC | PRN

## 2022-09-17 MED ORDER — COCONUT OIL OIL
1.0000 | TOPICAL_OIL | Status: DC | PRN
  Administered 2022-09-17: 1 via TOPICAL

## 2022-09-17 MED ORDER — OXYCODONE HCL 5 MG PO TABS: 5.0000 mg | ORAL_TABLET | ORAL | Status: DC | PRN

## 2022-09-17 MED ORDER — ONDANSETRON HCL 4 MG PO TABS
4.0000 mg | ORAL_TABLET | ORAL | Status: DC | PRN
  Administered 2022-09-18: 4 mg via ORAL
  Filled 2022-09-17: qty 1

## 2022-09-17 MED ORDER — ONDANSETRON HCL 4 MG/2ML IJ SOLN: 4.0000 mg | INTRAMUSCULAR | Status: DC | PRN

## 2022-09-17 MED ORDER — TETANUS-DIPHTH-ACELL PERTUSSIS 5-2.5-18.5 LF-MCG/0.5 IM SUSY: 0.5000 mL | PREFILLED_SYRINGE | Freq: Once | INTRAMUSCULAR | Status: DC

## 2022-09-17 MED ORDER — ZOLPIDEM TARTRATE 5 MG PO TABS: 5.0000 mg | ORAL_TABLET | Freq: Every evening | ORAL | Status: DC | PRN

## 2022-09-17 MED ORDER — PRENATAL MULTIVITAMIN CH
1.0000 | ORAL_TABLET | Freq: Every day | ORAL | Status: DC
  Administered 2022-09-17 – 2022-09-18 (×2): 1 via ORAL
  Filled 2022-09-17 (×2): qty 1

## 2022-09-17 MED ORDER — SIMETHICONE 80 MG PO CHEW: 80.0000 mg | CHEWABLE_TABLET | ORAL | Status: DC | PRN

## 2022-09-17 MED ORDER — DIPHENHYDRAMINE HCL 25 MG PO CAPS: 25.0000 mg | ORAL_CAPSULE | Freq: Four times a day (QID) | ORAL | Status: DC | PRN

## 2022-09-17 NOTE — Plan of Care (Signed)

## 2022-09-17 NOTE — Lactation Note (Signed)
This note was copied from a baby's chart. Lactation Consultation Note  Patient Name: Jordan Jackson [id number] Date: 09/17/2022 Age:28 hours Reason for consult: Initial assessment;Primapara;Early term 37-38.6wks;Maternal endocrine disorder Mom stated the baby has really only BF once since birth. The baby has been sleepy. LC stimulated the baby and woke him up. He is alert and calm looking around.  Placed at breast, has no interest in BF at this time. Worked w/mom on latching and positioning. Newborn feeding habits, behavior, STS, I&O, hand expression, supply and demand reviewed. Mom had pumped earlier. Just got a dot of really thick colostrum like thick jelly or glue. Encouraged mom to hydrate and keep pumping for stimulation if desired while baby doesn't have any interest at this time. LC gave #21 flanges. Mom has Spectra at home. Discussed her checking on getting flange inserts for that pump. Mom encouraged to feed baby 8-12 times/24 hours and with feeding cues.  Since baby has no interest in BF at this time. Encouraged mom to hold baby and have bonding time. Call for assistance as needed. Answered all of mom's questions at this time.   Maternal Data Has patient been taught Hand Expression?: Yes Does the patient have breastfeeding experience prior to this delivery?: No  Feeding    LATCH Score Latch: Too sleepy or reluctant, no latch achieved, no sucking elicited.  Audible Swallowing: None  Type of Nipple: Everted at rest and after stimulation  Comfort (Breast/Nipple): Soft / non-tender  Hold (Positioning): Full assist, staff holds infant at breast  LATCH Score: 4   Lactation Tools Discussed/Used Tools: Pump;Flanges Flange Size: 21 Breast pump type: Double-Electric Breast Pump Pumping frequency: q3hr  Interventions Interventions: Breast feeding basics reviewed;Adjust position;Assisted with latch;Support pillows;DEBP;Skin to skin;Position options;Breast massage;Hand  express;LC Services brochure;Breast compression  Discharge    Consult Status Consult Status: Follow-up Date: 09/18/22 Follow-up type: In-patient    Theodoro Kalata 09/17/2022, 10:39 PM

## 2022-09-18 LAB — COMPREHENSIVE METABOLIC PANEL
ALT: 26 U/L (ref 0–44)
AST: 46 U/L — ABNORMAL HIGH (ref 15–41)
Albumin: 2.3 g/dL — ABNORMAL LOW (ref 3.5–5.0)
Alkaline Phosphatase: 91 U/L (ref 38–126)
Anion gap: 7 (ref 5–15)
BUN: <5 mg/dL — ABNORMAL LOW (ref 6–20)
CO2: 26 mmol/L (ref 22–32)
Calcium: 8.7 mg/dL — ABNORMAL LOW (ref 8.9–10.3)
Chloride: 106 mmol/L (ref 98–111)
Creatinine, Ser: 0.62 mg/dL (ref 0.44–1.00)
GFR, Estimated: >60 mL/min (ref >60)
Glucose, Bld: 101 mg/dL — ABNORMAL HIGH (ref 70–99)
Potassium: 3.4 mmol/L — ABNORMAL LOW (ref 3.5–5.1)
Sodium: 139 mmol/L (ref 135–145)
Total Bilirubin: 0.3 mg/dL (ref 0.3–1.2)
Total Protein: 5.8 g/dL — ABNORMAL LOW (ref 6.5–8.1)

## 2022-09-18 LAB — CBC WITH DIFFERENTIAL/PLATELET
Abs Immature Granulocytes: 0.05 10*3/uL (ref 0.00–0.07)
Basophils Absolute: 0 10*3/uL (ref 0.0–0.1)
Basophils Relative: 0 %
Eosinophils Absolute: 0.2 10*3/uL (ref 0.0–0.5)
Eosinophils Relative: 2 %
HCT: 32.1 % — ABNORMAL LOW (ref 36.0–46.0)
Hemoglobin: 11 g/dL — ABNORMAL LOW (ref 12.0–15.0)
Immature Granulocytes: 0 %
Lymphocytes Relative: 15 %
Lymphs Abs: 1.8 10*3/uL (ref 0.7–4.0)
MCH: 30.2 pg (ref 26.0–34.0)
MCHC: 34.3 g/dL (ref 30.0–36.0)
MCV: 88.2 fL (ref 80.0–100.0)
Monocytes Absolute: 0.9 10*3/uL (ref 0.1–1.0)
Monocytes Relative: 7 %
Neutro Abs: 9.2 10*3/uL — ABNORMAL HIGH (ref 1.7–7.7)
Neutrophils Relative %: 76 %
Platelets: 242 10*3/uL (ref 150–400)
RBC: 3.64 MIL/uL — ABNORMAL LOW (ref 3.87–5.11)
RDW: 14.6 % (ref 11.5–15.5)
WBC: 12.2 10*3/uL — ABNORMAL HIGH (ref 4.0–10.5)
nRBC: 0 % (ref 0.0–0.2)

## 2022-09-18 LAB — CBC
HCT: 31.4 % — ABNORMAL LOW (ref 36.0–46.0)
Hemoglobin: 10.5 g/dL — ABNORMAL LOW (ref 12.0–15.0)
MCH: 29.7 pg (ref 26.0–34.0)
MCHC: 33.4 g/dL (ref 30.0–36.0)
MCV: 89 fL (ref 80.0–100.0)
Platelets: 237 10*3/uL (ref 150–400)
RBC: 3.53 MIL/uL — ABNORMAL LOW (ref 3.87–5.11)
RDW: 14.5 % (ref 11.5–15.5)
WBC: 15.1 10*3/uL — ABNORMAL HIGH (ref 4.0–10.5)
nRBC: 0 % (ref 0.0–0.2)

## 2022-09-18 LAB — LIPASE, BLOOD: Lipase: 27 U/L (ref 11–51)

## 2022-09-18 MED ORDER — FAMOTIDINE 20 MG PO TABS
20.0000 mg | ORAL_TABLET | Freq: Once | ORAL | Status: AC
  Administered 2022-09-18: 20 mg via ORAL
  Filled 2022-09-18: qty 1

## 2022-09-18 MED ORDER — PANTOPRAZOLE SODIUM 40 MG PO TBEC
40.0000 mg | DELAYED_RELEASE_TABLET | Freq: Every day | ORAL | Status: DC
  Administered 2022-09-19: 40 mg via ORAL
  Filled 2022-09-18: qty 1

## 2022-09-18 NOTE — Lactation Note (Signed)
This note was copied from a baby's chart. Lactation Consultation Note  Patient Name: Jordan Jackson [id number] Date: 09/18/2022 Age:28 hours Reason for consult: Mother's request;Breastfeeding assistance;Difficult latch  Birth Parent latched infant on her left breast using the cross cradle hold, infant sustained latch and was still breastfeeding for 10 minutes after LC left the room. Birth Parent plans to continue to latch infant 1st for every feeding and will continue to ask RN/LC for  further latch assistance if needed.   Current feeding plan: 1- Breastfeed infant according to hunger cues, on demand, 8 to 12+ times within 24 hours, STS. 2- Continue to ask RN/LC for latch assistance if needed. 3- Birth Parent will continue to supplement infant with formula as she works on BF infant and continue to use DEBP every 3 hours for 15 minutes on initial setting.   Maternal Data    Feeding    LATCH Score Latch: Grasps breast easily, tongue down, lips flanged, rhythmical sucking.  Audible Swallowing: A few with stimulation  Type of Nipple: Everted at rest and after stimulation  Comfort (Breast/Nipple): Soft / non-tender (short shafted, Birth Parent will do reverse pressure softening, hand express or pre-pump breast with hand pump prior to latching infant at the breast.)  Hold (Positioning): Assistance needed to correctly position infant at breast and maintain latch.  LATCH Score: 8   Lactation Tools Discussed/Used    Interventions Interventions: Support pillows;Adjust position;Assisted with latch;Skin to skin;Position options;Breast compression;Pre-pump if needed;Education  Discharge    Consult Status Consult Status: Follow-up Date: 09/19/22 Follow-up type: In-patient    Eulis Canner 09/18/2022, 11:27 PM

## 2022-09-18 NOTE — Progress Notes (Signed)
Reassessment: pt not feeling much different. RN called MD Brien Mates, see orders. RN went into room to give scheduled medications and pt verbalized another large bowel movement, very soft/diarrhea. Pt said her stomach felt a little better after that, says body is acting similar to when she has IBS flare-ups. She takes OTC meds for support- pepcid, protonix, etc. RN including pt in plan of care and will update MD as needed.

## 2022-09-18 NOTE — Progress Notes (Signed)
  Pt called RN to room with c/o slight lightheadedness and "internal" shaky feelings. BP and VS WNL. Per pt, hasn't felt this way when ambulating this AM. Per pt, hasn't had much water or rest since delivery. RN reviewed again when to call for s/sx related to pre-e, pt denies any additional symptoms. RN encouraged rest and hydration, and RN encouraged calling for RN again if symptoms don't improve soon.

## 2022-09-18 NOTE — Progress Notes (Signed)
RN alerted by [facility] pt c/o emesis, continued dizziness. RN entered room and pt in bathroom. Pt reports generally not feeling well- couple episodes of emesis, nausea before (resolved after), still some lightheadedness. Pt reports bowel movement. 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 RN gave PRN anti-emetic per pt request and encouraged rest. VS taken with increased BP, consistent with ambulating in room and emesis episode. Fundal assessment and bleeding WNL. MD Marinone notified of pt current status, ordered RN to reassess in 60 minutes and alert MD if worse or not improving. RN verbalized understanding.

## 2022-09-18 NOTE — Progress Notes (Signed)
Post Partum Day 1 Subjective: Patient is doing well this morning. Pain is controlled. Ambulating, voiding, tolerating PO. Minimal lochia. Breastfeeding.   Objective: Patient Vitals for the past 24 hrs:  BP Temp Temp src Pulse Resp SpO2  09/18/22 0747 (!) 137/90 -- -- 86 16 100 %  09/18/22 0550 (!) 117/90 (!) 97.5 F (36.4 C) Oral 99 16 99 %  09/18/22 0154 126/82 97.6 F (36.4 C) Oral 96 16 99 %  09/17/22 2054 128/85 98.2 F (36.8 C) Oral 91 18 98 %  09/17/22 1626 131/84 98.2 F (36.8 C) Oral 90 18 99 %  09/17/22 1247 118/80 98 F (36.7 C) Oral 91 18 98 %  09/17/22 1133 (!) 147/83 98.2 F (36.8 C) Oral (!) 102 18 99 %  09/17/22 1115 134/80 98 F (36.7 C) Oral 98 -- --  09/17/22 1100 129/74 -- -- (!) 102 -- --  09/17/22 1015 (!) 143/78 -- -- 95 -- --  09/17/22 1000 (!) 156/82 -- -- (!) 103 -- --  09/17/22 0945 137/80 -- -- 98 -- --    Physical Exam:  General: alert, cooperative, and no distress Lochia: appropriate Uterine Fundus: firm DVT Evaluation: No evidence of DVT seen on physical exam.  Recent Labs    09/15/22 2053 09/16/22 0656 09/17/22 1138 09/18/22 0425  WBC 9.4 9.2 17.3* 15.1*  HGB 12.7 11.9* 11.9* 10.5*  HCT 37.9 34.7* 34.3* 31.4*  PLT 228 203 206 237    Recent Labs    09/15/22 1337  NA 134*  K 3.4*  CL 106  BUN <5*  CREATININE 0.56  GLUCOSE 74  BILITOT 0.4  ALT 15  AST 22  ALKPHOS 89  PROT 5.8*  ALBUMIN 2.5*    Recent Labs    09/15/22 1337  CALCIUM 8.5*    No results for input(s): "PROTIME", "APTT", "INR" in the last 72 hours.  No results for input(s): "PROTIME", "APTT", "INR", "FIBRINOGEN" in the last 72 hours. Assessment/Plan: Jennefer Bravo 27 y.o. [name] PPD#1 sp SVD 1. PPC: routine PP care 2. Chronic hypertension: blood pressures normal overnight and borderline this morning. Will monitor and start antihypertensive if persistently elevated.  3. Desires neonatal circumcision, R/B/A of procedure discussed at length. Pt  understands that neonatal circumcision is not considered medically necessary and is elective. The risks include, but are not limited to bleeding, infection, damage to the penis, development of scar tissue, and having to have it redone at a later date. Pt understands theses risks and wishes to proceed.  4. Rh pos 5: Dispo: anticipate discharge home tomorrow pending blood pressures   LOS: 3 days   Rowland Lathe 09/18/2022, 9:33 AM

## 2022-09-18 NOTE — Anesthesia Postprocedure Evaluation (Signed)
Anesthesia Post Note  Patient: Jordan Jackson  Procedure(s) Performed: AN AD HOC LABOR EPIDURAL     Patient location during evaluation: Mother Baby Anesthesia Type: Epidural Level of consciousness: awake and alert Pain management: pain level controlled Vital Signs Assessment: post-procedure vital signs reviewed and stable Respiratory status: spontaneous breathing, nonlabored ventilation and respiratory function stable Cardiovascular status: stable Postop Assessment: no headache, no backache and epidural receding Anesthetic complications: no  No notable events documented.  Last Vitals:  Vitals:   09/18/22 0550 09/18/22 0747  BP: (!) 117/90 (!) 137/90  Pulse: 99 86  Resp: 16 16  Temp: (!) 36.4 C   SpO2: 99% 100%    Last Pain:  Vitals:   09/18/22 0550  TempSrc: Oral  PainSc:    Pain Goal:                   [name]

## 2022-09-18 NOTE — Lactation Note (Signed)
This note was copied from a baby's chart. Lactation Consultation Note  Patient Name: Jordan Jackson [id number] Date: 09/18/2022 Age:28 hours Reason for consult: Early term 37-38.6wks;Infant weight loss;1st time breastfeeding;Follow-up assessment (-1.84% weight loss, Birth Parent feeding preferance is breast and formula feeding.)  Per Birth Parent, she has not latched infant today mostly been formula feeding but would like assistance with latch for the next feeding. [facility] written name on white board for Birth Parent to call for latch assistance with the next feeding . Birth Parent has been using DEBP every 3 hours for 15 minutes on initial setting and now starting to expressed drops of colostrum that she is finger feeding to infant. Infant had 6 voids and 2 stools since birth. Birth Parent will continue to feed infant by cues, on demand, 8 to 12+ times within 24 hours, STS. Birth Parent will continue to use DEBP every 3 hours for 15 minutes on initial setting. Birth Parent knows that EBM is safe at room temperature for 4 hours whereas formula must be used within 1 hour. Birth Parent is having emesis ( vomiting) LC informed RN.  Maternal Data    Feeding Mother's Current Feeding Choice: Breast Milk and Formula  LATCH Score                    Lactation Tools Discussed/Used    Interventions Interventions: Education;Skin to skin;DEBP  Discharge    Consult Status Consult Status: Follow-up Date: 09/19/22 Follow-up type: In-patient    Eulis Canner 09/18/2022, 4:25 PM

## 2022-09-19 ENCOUNTER — Other Ambulatory Visit: Payer: Self-pay

## 2022-09-19 MED ORDER — IBUPROFEN 800 MG PO TABS
800.0000 mg | ORAL_TABLET | Freq: Three times a day (TID) | ORAL | 1 refills | Status: DC | PRN
  Filled 2022-09-19: qty 60, 20d supply, fill #0

## 2022-09-19 NOTE — Progress Notes (Signed)
Post Partum Day 2 Subjective: Patient is doing well this morning. Pain is controlled. Ambulating, voiding, tolerating PO. Minimal lochia. Breastfeeding. Baby boy s/p circ. GI symptoms from known IBS are improved today, no issues with BM  Objective: Patient Vitals for the past 24 hrs:  BP Temp Temp src Pulse Resp SpO2  09/19/22 0520 110/65 98.2 F (36.8 C) Oral 89 18 95 %  09/19/22 0205 117/65 98.4 F (36.9 C) Oral 84 16 99 %  09/18/22 2142 128/80 -- -- 92 17 99 %  09/18/22 1717 139/89 -- -- 98 18 --  09/18/22 1631 (!) 148/96 -- -- 96 18 100 %  09/18/22 1300 123/76 97.7 F (36.5 C) Oral 90 17 100 %     Physical Exam:  General: alert, cooperative, and no distress Lochia: appropriate Uterine Fundus: firm DVT Evaluation: No evidence of DVT seen on physical exam.  Recent Labs    09/17/22 1138 09/18/22 0425 09/18/22 1748  WBC 17.3* 15.1* 12.2*  HGB 11.9* 10.5* 11.0*  HCT 34.3* 31.4* 32.1*  PLT 206 237 242     Recent Labs    09/18/22 1748  NA 139  K 3.4*  CL 106  BUN <5*  CREATININE 0.62  GLUCOSE 101*  BILITOT 0.3  ALT 26  AST 46*  ALKPHOS 91  PROT 5.8*  ALBUMIN 2.3*     Recent Labs    09/18/22 1748  CALCIUM 8.7*     No results for input(s): "PROTIME", "APTT", "INR" in the last 72 hours.  No results for input(s): "PROTIME", "APTT", "INR", "FIBRINOGEN" in the last 72 hours. Assessment/Plan: Jennefer Bravo 27 y.o. [name] PPD#2 sp SVD 1. PPC: routine PP care 2. Chronic hypertension: normotensive since emesis episode yesterday, has not required meds. 1wk BP check planned and reviewed with patient 3. Baby boy s/p circ 4. Rh pos 5: Dispo: anticipate discharge home today   LOS: 4 days   Deliah Boston 09/19/2022, 9:36 AM

## 2022-09-19 NOTE — Discharge Summary (Signed)
Postpartum Discharge Summary  Date of Service updated     Patient Name: Jordan Jackson DOB: 10-16-1995 MRN: [id number]  Date of admission: 09/15/2022 Delivery date:09/17/2022  Delivering provider: Allyn Kenner  Date of discharge: 09/19/2022  Admitting diagnosis: Gestational (pregnancy-induced) hypertension without significant proteinuria, third trimester [O13.3] Intrauterine pregnancy: [redacted]w[redacted]d     Secondary diagnosis:  Principal Problem:   Gestational (pregnancy-induced) hypertension without significant proteinuria, third trimester  Additional problems: BMI 45    Discharge diagnosis: Term Pregnancy Delivered and CHTN with exacerbation of blood pressure                                               Post partum procedures: none Augmentation: AROM, Pitocin, and IP Foley Complications: None and 123XX123 hours  Hospital course: Induction of Labor With Vaginal Delivery   27 y.o. yo [name] at [redacted]w[redacted]d was admitted to the hospital 09/15/2022 for induction of labor.  Indication for induction:  CHTN with worsening BP at term .  Patient had an labor course complicated by ROM [id number] Membrane Rupture Time/Date: 6:37 AM ,09/16/2022   Delivery Method:Vaginal, Spontaneous  Episiotomy: None  Lacerations:  2nd degree  Details of delivery can be found in separate delivery note.  Patient had a postpartum course complicated by none. Patient is discharged home 09/19/22.  Newborn Data: Birth date:09/17/2022  Birth time:9:18 AM  Gender:Female  Living status:Living  Apgars:9 ,9  Weight:2990 g   Physical exam  Vitals:   09/18/22 1717 09/18/22 2142 09/19/22 0205 09/19/22 0520  BP: 139/89 128/80 117/65 110/65  Pulse: 98 92 84 89  Resp: 18 17 16 18   Temp:   98.4 F (36.9 C) 98.2 F (36.8 C)  TempSrc:   Oral Oral  SpO2:  99% 99% 95%  Weight:      Height:       General: alert, cooperative, and no distress Lochia: appropriate Uterine Fundus: firm Incision: N/A DVT Evaluation: No evidence of DVT  seen on physical exam. Negative Homan's sign. No cords or calf tenderness. Labs: Lab Results  Component Value Date   WBC 12.2 (H) 09/18/2022   HGB 11.0 (L) 09/18/2022   HCT 32.1 (L) 09/18/2022   MCV 88.2 09/18/2022   PLT 242 09/18/2022      Latest Ref Rng & Units 09/18/2022    5:48 PM  CMP  Glucose 70 - 99 mg/dL 101   BUN 6 - 20 mg/dL <5   Creatinine 0.44 - 1.00 mg/dL 0.62   Sodium 135 - 145 mmol/L 139   Potassium 3.5 - 5.1 mmol/L 3.4   Chloride 98 - 111 mmol/L 106   CO2 22 - 32 mmol/L 26   Calcium 8.9 - 10.3 mg/dL 8.7   Total Protein 6.5 - 8.1 g/dL 5.8   Total Bilirubin 0.3 - 1.2 mg/dL 0.3   Alkaline Phos 38 - 126 U/L 91   AST 15 - 41 U/L 46   ALT 0 - 44 U/L 26    Edinburgh Score:    09/17/2022   11:47 AM  Edinburgh Postnatal Depression Scale Screening Tool  I have been able to laugh and see the funny side of things. 0  I have looked forward with enjoyment to things. 0  I have blamed myself unnecessarily when things went wrong. 0  I have been anxious or worried for no good reason. 0  I have felt scared or panicky for no good reason. 0  Things have been getting on top of me. 0  I have been so unhappy that I have had difficulty sleeping. 0  I have felt sad or miserable. 0  I have been so unhappy that I have been crying. 0  The thought of harming myself has occurred to me. 0  Edinburgh Postnatal Depression Scale Total 0      After visit meds:  Allergies as of 09/19/2022   No Known Allergies      Medication List     STOP taking these medications    clindamycin 1 % external solution Commonly known as: CLEOCIN T   Fluocinolone Acetonide Scalp 0.01 % Oil Commonly known as: Derma-Smoothe/FS Scalp       TAKE these medications    ibuprofen 800 MG tablet Commonly known as: ADVIL Take 1 tablet (800 mg total) by mouth every 8 (eight) hours as needed.   pantoprazole 40 MG tablet Commonly known as: PROTONIX Take 1 tablet (40 mg total) by mouth daily.    pantoprazole 40 MG tablet Commonly known as: Protonix Take 1 tablet (40 mg total) by mouth daily.   Taron-C DHA 35-1 MG Caps Take 1 capsule by mouth daily.         Discharge home in stable condition Infant Feeding: Breast Infant Disposition:home with mother Discharge instruction: per After Visit Summary and Postpartum booklet. Activity: Advance as tolerated. Pelvic rest for 6 weeks.  Diet: routine diet Anticipated Birth Control: Unsure Postpartum Appointment:6 weeks Additional Postpartum F/U: BP check 1 week Follow up Visit:[facility]    09/19/2022 Deliah Boston, MD

## 2022-09-25 ENCOUNTER — Other Ambulatory Visit: Payer: Self-pay

## 2022-09-25 MED ORDER — NIFEDIPINE ER OSMOTIC RELEASE 30 MG PO TB24
30.0000 mg | ORAL_TABLET | Freq: Every day | ORAL | 0 refills | Status: DC
  Filled 2022-09-25: qty 30, 30d supply, fill #0

## 2022-09-28 ENCOUNTER — Telehealth (HOSPITAL_COMMUNITY): Payer: Self-pay | Admitting: *Deleted

## 2022-09-28 ENCOUNTER — Telehealth (HOSPITAL_COMMUNITY): Payer: Self-pay

## 2022-09-28 NOTE — Telephone Encounter (Signed)
Chart review.

## 2022-09-28 NOTE — Telephone Encounter (Signed)
Attempted hospital discharge follow-up call. Left message for patient to return RN call with any questions or concerns. Erline Levine, RN, 09/28/22, [phone]

## 2022-10-03 DIAGNOSIS — O139 Gestational [pregnancy-induced] hypertension without significant proteinuria, unspecified trimester: Secondary | ICD-10-CM | POA: Diagnosis not present

## 2022-10-31 ENCOUNTER — Other Ambulatory Visit: Payer: Self-pay

## 2022-10-31 DIAGNOSIS — Z8759 Personal history of other complications of pregnancy, childbirth and the puerperium: Secondary | ICD-10-CM | POA: Diagnosis not present

## 2022-10-31 DIAGNOSIS — Z3009 Encounter for other general counseling and advice on contraception: Secondary | ICD-10-CM | POA: Diagnosis not present

## 2022-10-31 DIAGNOSIS — Z1332 Encounter for screening for maternal depression: Secondary | ICD-10-CM | POA: Diagnosis not present

## 2022-10-31 MED ORDER — NIFEDIPINE ER OSMOTIC RELEASE 30 MG PO TB24
30.0000 mg | ORAL_TABLET | Freq: Every day | ORAL | 1 refills | Status: DC
  Filled 2022-10-31 – 2022-11-23 (×2): qty 30, 30d supply, fill #0

## 2022-11-01 ENCOUNTER — Encounter: Payer: Self-pay | Admitting: Physician Assistant

## 2022-11-01 ENCOUNTER — Ambulatory Visit (INDEPENDENT_AMBULATORY_CARE_PROVIDER_SITE_OTHER): Payer: 59 | Admitting: Physician Assistant

## 2022-11-01 VITALS — BP 107/73 | HR 59 | Temp 97.7°F | Ht 63.0 in | Wt 241.4 lb

## 2022-11-01 DIAGNOSIS — R635 Abnormal weight gain: Secondary | ICD-10-CM | POA: Diagnosis not present

## 2022-11-01 DIAGNOSIS — R209 Unspecified disturbances of skin sensation: Secondary | ICD-10-CM

## 2022-11-01 DIAGNOSIS — R030 Elevated blood-pressure reading, without diagnosis of hypertension: Secondary | ICD-10-CM | POA: Diagnosis not present

## 2022-11-01 LAB — CBC WITH DIFFERENTIAL/PLATELET
Basophils Absolute: 0 10*3/uL (ref 0.0–0.1)
Basophils Relative: 0.8 % (ref 0.0–3.0)
Eosinophils Absolute: 0.3 10*3/uL (ref 0.0–0.7)
Eosinophils Relative: 5.8 % — ABNORMAL HIGH (ref 0.0–5.0)
HCT: 39.3 % (ref 36.0–46.0)
Hemoglobin: 13.3 g/dL (ref 12.0–15.0)
Lymphocytes Relative: 39.4 % (ref 12.0–46.0)
Lymphs Abs: 1.8 10*3/uL (ref 0.7–4.0)
MCHC: 33.7 g/dL (ref 30.0–36.0)
MCV: 88.3 fl (ref 78.0–100.0)
Monocytes Absolute: 0.3 10*3/uL (ref 0.1–1.0)
Monocytes Relative: 7.5 % (ref 3.0–12.0)
Neutro Abs: 2.2 10*3/uL (ref 1.4–7.7)
Neutrophils Relative %: 46.5 % (ref 43.0–77.0)
Platelets: 357 10*3/uL (ref 150.0–400.0)
RBC: 4.45 Mil/uL (ref 3.87–5.11)
RDW: 14.1 % (ref 11.5–15.5)
WBC: 4.6 10*3/uL (ref 4.0–10.5)

## 2022-11-01 LAB — COMPREHENSIVE METABOLIC PANEL
ALT: 18 U/L (ref 0–35)
AST: 19 U/L (ref 0–37)
Albumin: 4.1 g/dL (ref 3.5–5.2)
Alkaline Phosphatase: 116 U/L (ref 39–117)
BUN: 12 mg/dL (ref 6–23)
CO2: 25 mEq/L (ref 19–32)
Calcium: 9.2 mg/dL (ref 8.4–10.5)
Chloride: 105 mEq/L (ref 96–112)
Creatinine, Ser: 0.61 mg/dL (ref 0.40–1.20)
GFR: 122.01 mL/min (ref >60.00)
Glucose, Bld: 86 mg/dL (ref 70–99)
Potassium: 4 mEq/L (ref 3.5–5.1)
Sodium: 141 mEq/L (ref 135–145)
Total Bilirubin: 0.5 mg/dL (ref 0.2–1.2)
Total Protein: 7.2 g/dL (ref 6.0–8.3)

## 2022-11-01 LAB — HEMOGLOBIN A1C: Hgb A1c MFr Bld: 5.3 % (ref 4.6–6.5)

## 2022-11-01 LAB — TSH: TSH: 1.57 u[IU]/mL (ref 0.35–5.50)

## 2022-11-01 NOTE — Patient Instructions (Signed)
It was great to see you!  Keep up the GREAT work and congrats on your sweet baby!  Continue nifedipine 30 mg daily  Let's follow-up in 3 months, sooner if you have concerns.  Take care,  Jarold Motto PA-C

## 2022-11-01 NOTE — Progress Notes (Signed)
Jordan Jackson is a 27 y.o. female here for a new problem.  History of Present Illness:   Chief Complaint  Patient presents with   Hypertension    Home BP /135/88 average     HPI  Elevated Blood Pressure History of hypertension during last few days of pregnancy requiring induction Blood pressure normal today at 107/73. Started on nifedipine 30 mg daily approximately two weeks post-partum  Home blood pressures as follows: 135/88 average. Has been checking blood pressure 1-2 times per week. Water intake has been inadequate for the past 2 days.  She is currently breast feeding without problems.   Abnormal bilateral arm sensation Has been having weakness in arms when extending them for the past three days. Intensity of this has not changed. Denies pain in arms, weakness in legs. Was not straining arms before symptoms began. Normal range of motion in arms.   Weight gain She has had issues with weight gain since pregnancy Has also noticed weight gain since post-partum  Past Medical History:  Diagnosis Date   Fatty liver 10/24/2016   Refer to GI   Gastritis    easinophilic    GERD (gastroesophageal reflux disease)    Irregular periods/menstrual cycles 08/10/2015   Menstrual cramps 08/10/2015   Pelvic pain in female      Social History   Tobacco Use   Smoking status: Never   Smokeless tobacco: Never  Vaping Use   Vaping Use: Never used  Substance Use Topics   Alcohol use: Not Currently   Drug use: No    Past Surgical History:  Procedure Laterality Date   BIOPSY  12/26/2016   Procedure: BIOPSY;  Surgeon: West Bali, MD;  [facility];  Service: Endoscopy;;  gastric duodenal   COLONOSCOPY N/A 12/26/2016   Procedure: COLONOSCOPY;  Surgeon: West Bali, MD;  [facility]: [facility];  Service: Endoscopy;  Laterality: N/A;  1:00pm   ESOPHAGOGASTRODUODENOSCOPY N/A 12/26/2016   Procedure: ESOPHAGOGASTRODUODENOSCOPY (EGD);  Surgeon: West Bali, MD;  [facility]: [facility];  Service: Endoscopy;  Laterality: N/A;   NO PAST SURGERIES      Family History  Problem Relation Age of Onset   Hypertension Mother    Arthritis Mother    Hypertension Father    Diabetes Maternal Grandmother    Hypertension Paternal Grandmother    Hypertension Paternal Grandfather    Diabetes Paternal Grandfather    Inflammatory bowel disease Neg Hx    Liver disease Neg Hx    Celiac disease Neg Hx    Colon cancer Neg Hx     No Known Allergies  Current Medications:   Current Outpatient Medications:    NIFEdipine (PROCARDIA XL) 30 MG 24 hr tablet, Take 1 tablet (30 mg total) by mouth daily as directed, Disp: 30 tablet, Rfl: 1   Prenat-FeFum-FePo-FA-Omega 3 (TARON-C DHA) 35-1 MG CAPS, Take 1 capsule by mouth daily., Disp: 30 capsule, Rfl: 5   Review of Systems:   Review of Systems  Constitutional:  Negative for fever and malaise/fatigue.  HENT:  Negative for congestion.   Eyes:  Negative for blurred vision.  Respiratory:  Negative for cough and shortness of breath.   Cardiovascular:  Negative for chest pain, palpitations and leg swelling.  Gastrointestinal:  Negative for vomiting.  Genitourinary:        (-) Vaginal discomfort  Musculoskeletal:  Negative for back pain and myalgias (Arms).  Skin:  Negative for rash.  Neurological:  Positive for weakness (  Arms). Negative for loss of consciousness and headaches.  Psychiatric/Behavioral:  Negative for depression.     Vitals:   Vitals:   11/01/22 1110  BP: 107/73  Pulse: (!) 59  Temp: 97.7 F (36.5 C)  TempSrc: Temporal  SpO2: 100%  Weight: 241 lb 6.4 oz (109.5 kg)  Height: 5\' 3"  (1.6 m)     Body mass index is 42.76 kg/m.  Physical Exam:   Physical Exam Vitals and nursing note reviewed.  Constitutional:      General: She is not in acute distress.    Appearance: She is well-developed. She is not ill-appearing or toxic-appearing.  Cardiovascular:     Rate and Rhythm: Normal  rate and regular rhythm.     Pulses: Normal pulses.     Heart sounds: Normal heart sounds, S1 normal and S2 normal.  Pulmonary:     Effort: Pulmonary effort is normal.     Breath sounds: Normal breath sounds.  Musculoskeletal:     Comments: Normal ROM of b/l arms  Skin:    General: Skin is warm and dry.  Neurological:     Mental Status: She is alert.     GCS: GCS eye subscore is 4. GCS verbal subscore is 5. GCS motor subscore is 6.     Comments: B/l grip strength 5/5 Normal perceived sensation of b/l arms  Psychiatric:        Speech: Speech normal.        Behavior: Behavior normal. Behavior is cooperative.     Assessment and Plan:   Elevated blood pressure reading Well controlled Continue nifedipine 30 mg XR daily Recommend continued monitoring at home  Reach out if BP is consistently > 130/90 or other concerns Follow-up in 3 months, sooner if concerns  Weight gain Update blood work today to rule out organic cause of symptom(s) Do not recommend drastic weight loss attempts when breastfeeding Continue to monitor Follow-up if any new symptom(s) such as SHORTNESS OF BREATH or increase lower extremity swelling  Abnormal arm sensation Unclear etiology Exam normal Continue to monitor If new/worsening/ongoing will refer to sports medicicine   I,Alexander Ruley,acting as a scribe for [employer], PA.,have documented all relevant documentation on the behalf of Jarold Motto, PA,as directed by  Jarold Motto, PA while in the presence of Jarold Motto, [place].   I, Jarold Motto, [place], have reviewed all documentation for this visit. The documentation on 11/01/22 for the exam, diagnosis, procedures, and orders are all accurate and complete.    Jarold Motto, PA-C

## 2022-11-08 DIAGNOSIS — Z30430 Encounter for insertion of intrauterine contraceptive device: Secondary | ICD-10-CM | POA: Diagnosis not present

## 2022-11-14 ENCOUNTER — Other Ambulatory Visit: Payer: Self-pay

## 2022-11-24 ENCOUNTER — Other Ambulatory Visit: Payer: Self-pay

## 2022-12-03 ENCOUNTER — Telehealth: Payer: 59 | Admitting: Family

## 2022-12-03 ENCOUNTER — Other Ambulatory Visit: Payer: Self-pay

## 2022-12-03 DIAGNOSIS — J069 Acute upper respiratory infection, unspecified: Secondary | ICD-10-CM

## 2022-12-03 MED ORDER — FLUTICASONE PROPIONATE 50 MCG/ACT NA SUSP
2.0000 | Freq: Every day | NASAL | 6 refills | Status: DC
  Filled 2022-12-03: qty 16, 30d supply, fill #0

## 2022-12-03 MED ORDER — CETIRIZINE HCL 10 MG PO TABS
10.0000 mg | ORAL_TABLET | Freq: Every day | ORAL | 1 refills | Status: DC
  Filled 2022-12-03: qty 100, 100d supply, fill #0

## 2022-12-03 NOTE — Progress Notes (Signed)

## 2022-12-04 ENCOUNTER — Other Ambulatory Visit: Payer: Self-pay

## 2022-12-07 ENCOUNTER — Encounter: Payer: Self-pay | Admitting: Physician Assistant

## 2022-12-07 NOTE — Telephone Encounter (Signed)
Please see pt message and advise 

## 2022-12-15 ENCOUNTER — Other Ambulatory Visit: Payer: Self-pay

## 2023-01-11 ENCOUNTER — Other Ambulatory Visit: Payer: Self-pay

## 2023-01-12 ENCOUNTER — Other Ambulatory Visit: Payer: Self-pay

## 2023-01-12 ENCOUNTER — Telehealth: Payer: Self-pay | Admitting: Lactation Services

## 2023-01-12 MED ORDER — NYSTATIN-TRIAMCINOLONE 100000-0.1 UNIT/GM-% EX CREA
1.0000 | TOPICAL_CREAM | Freq: Once | CUTANEOUS | 1 refills | Status: AC
  Filled 2023-01-12: qty 30, 30d supply, fill #0

## 2023-01-12 MED ORDER — FLUCONAZOLE 150 MG PO TABS
150.0000 mg | ORAL_TABLET | ORAL | 0 refills | Status: DC
  Filled 2023-01-12: qty 5, 15d supply, fill #0

## 2023-01-12 NOTE — Telephone Encounter (Addendum)
Patient called and LM that she has pain to left breast with swelling and requested a call back  Returned call to patient. She reports she is exclusively pumping. She reports pain started in left nipple one day ago.   She denies, knots, redness, fever, flu like symptoms, recent antibiotics, or bleb.   She reports nipple and left breast is hurting. She reports it is painful with and without pumping. She reports burning and shooting pains with breast filling and after pumping. She reports the left nipple is swollen.   Patient is pumping with Spectra Gold pump with # 20 flanges and has been. She reports only nipple pulling into barrel. Asked her to look next time she pumps and is a lot of tissue is pulling into the barrel, will need to downsize flanges again.   Reviewed milk blebs and plugged ducts: Advised ice pack x 10 minutes prior to pumping x 24 hours and Ibuprofen 600 mg QID until pain resolves.   Reviewed suspicion of yeast infection:  Reviewed yeast protocol for mom and infant and will send written yeast protocol for patient via My chart.  Reviewed calling OB provider to prescribe Nystatin Cream and Diflucan.   Infant has a coated tongue, he is not currently latching, he is being evaluated by Pediatrician today.   Advised mom to call back with any questions or concerns as needed.

## 2023-01-24 DIAGNOSIS — Z30431 Encounter for routine checking of intrauterine contraceptive device: Secondary | ICD-10-CM | POA: Diagnosis not present

## 2023-01-31 NOTE — Progress Notes (Shared)
Jordan Jackson is a 27 y.o. female here for a follow up of a pre-existing problem.  History of Present Illness:   No chief complaint on file.   HPI  Elevated Blood Pressure History of hypertension during last few days of pregnancy requiring induction Blood pressure normal today at 107/73. Started on nifedipine 30 mg daily approximately two weeks post-partum. Home blood pressure readings are as follows: 135/88 average. Has been checking blood pressure 1-2 times per week. Water intake has been inadequate for the past 2 days. She is currently breast feeding without problems.   Abnormal bilateral arm sensation Has been having weakness in arms when extending them for the past three days. Intensity of this has not changed. Denies pain in arms, weakness in legs. Was not straining arms before symptoms began. Normal range of motion in arms.  Weight gain She has had issues with weight gain since pregnancy Has also noticed weight gain since post-partum   Past Medical History:  Diagnosis Date   Fatty liver 10/24/2016   Refer to GI   Gastritis    easinophilic    GERD (gastroesophageal reflux disease)    Irregular periods/menstrual cycles 08/10/2015   Menstrual cramps 08/10/2015   Pelvic pain in female      Social History   Tobacco Use   Smoking status: Never   Smokeless tobacco: Never  Vaping Use   Vaping status: Never Used  Substance Use Topics   Alcohol use: Not Currently   Drug use: No    Past Surgical History:  Procedure Laterality Date   BIOPSY  12/26/2016   Procedure: BIOPSY;  Surgeon: West Bali, MD;  [facility];  Service: Endoscopy;;  gastric duodenal   COLONOSCOPY N/A 12/26/2016   Procedure: COLONOSCOPY;  Surgeon: West Bali, MD;  [facility];  Service: Endoscopy;  Laterality: N/A;  1:00pm   ESOPHAGOGASTRODUODENOSCOPY N/A 12/26/2016   Procedure: ESOPHAGOGASTRODUODENOSCOPY (EGD);  Surgeon: West Bali, MD;  [facility];  Service: Endoscopy;  Laterality: N/A;   NO PAST SURGERIES      Family History  Problem Relation Age of Onset   Hypertension Mother    Arthritis Mother    Hypertension Father    Diabetes Maternal Grandmother    Hypertension Paternal Grandmother    Hypertension Paternal Grandfather    Diabetes Paternal Grandfather    Inflammatory bowel disease Neg Hx    Liver disease Neg Hx    Celiac disease Neg Hx    Colon cancer Neg Hx     No Known Allergies  Current Medications:   Current Outpatient Medications:    cetirizine (ZYRTEC ALLERGY) 10 MG tablet, Take 1 tablet (10 mg total) by mouth daily., Disp: 100 tablet, Rfl: 1   fluconazole (DIFLUCAN) 150 MG tablet, Take 1 tablet (150 mg total) by mouth every 3 (three) days., Disp: 5 tablet, Rfl: 0   fluticasone (FLONASE) 50 MCG/ACT nasal spray, Place 2 sprays into both nostrils daily., Disp: 16 g, Rfl: 6   NIFEdipine (PROCARDIA XL) 30 MG 24 hr tablet, Take 1 tablet (30 mg total) by mouth daily as directed, Disp: 30 tablet, Rfl: 1   Prenat-FeFum-FePo-FA-Omega 3 (TARON-C DHA) 35-1 MG CAPS, Take 1 capsule by mouth daily., Disp: 30 capsule, Rfl: 5   Review of Systems:   ROS  Vitals:   There were no vitals filed for this visit.   There is no height or weight on file to calculate BMI.  Physical Exam:  Physical Exam  Assessment and Plan:   ***   I,Alexander Ruley,acting as a scribe for [employer], PA.,have documented all relevant documentation on the behalf of Jarold Motto, PA,as directed by  Jarold Motto, PA while in the presence of Jarold Motto, [place].   ***  Jarold Motto, PA-C

## 2023-02-02 ENCOUNTER — Ambulatory Visit: Payer: 59 | Admitting: Physician Assistant

## 2023-02-07 ENCOUNTER — Ambulatory Visit: Payer: 59 | Admitting: Physician Assistant

## 2023-02-08 DIAGNOSIS — I308 Other forms of acute pericarditis: Secondary | ICD-10-CM | POA: Diagnosis not present

## 2023-02-23 ENCOUNTER — Telehealth: Payer: 59 | Admitting: Nurse Practitioner

## 2023-02-23 DIAGNOSIS — H109 Unspecified conjunctivitis: Secondary | ICD-10-CM

## 2023-02-24 MED ORDER — POLYMYXIN B-TRIMETHOPRIM 10000-0.1 UNIT/ML-% OP SOLN: 1.0000 [drp] | OPHTHALMIC | 0 refills | Status: DC

## 2023-02-24 NOTE — Progress Notes (Signed)
I have spent 5 minutes in review of e-visit questionnaire, review and updating patient chart, medical decision making and response to patient.  ° °Zelda W Fleming, NP ° °  °

## 2023-02-24 NOTE — Progress Notes (Signed)
E-Visit for Pink Eye ? ? ?We are sorry that you are not feeling well.  Here is how we plan to help! ? ?Based on what you have shared with me it looks like you have conjunctivitis.  Conjunctivitis is a common inflammatory or infectious condition of the eye that is often referred to as "pink eye".  In most cases it is contagious (viral or bacterial). However, not all conjunctivitis requires antibiotics (ex. Allergic).  We have made appropriate suggestions for you based upon your presentation. ? ?I have prescribed Polytrim Ophthalmic drops 1-2 drops 4 times a day times 5 days ? ?Pink eye can be highly contagious.  It is typically spread through direct contact with secretions, or contaminated objects or surfaces that one may have touched.  Strict handwashing is suggested with soap and water is urged.  If not available, use alcohol based had sanitizer.  Avoid unnecessary touching of the eye.  If you wear contact lenses, you will need to refrain from wearing them until you see no white discharge from the eye for at least 24 hours after being on medication.  You should see symptom improvement in 1-2 days after starting the medication regimen.  Call us if symptoms are not improved in 1-2 days. ? ?Home Care: ?Wash your hands often! ?Do not wear your contacts until you complete your treatment plan. ?Avoid sharing towels, bed linen, personal items with a person who has pink eye. ?See attention for anyone in your home with similar symptoms. ? ?Get Help Right Away If: ?Your symptoms do not improve. ?You develop blurred or loss of vision. ?Your symptoms worsen (increased discharge, pain or redness) ? ? ?Thank you for choosing an e-visit. ? ?Your e-visit answers were reviewed by a board certified advanced clinical practitioner to complete your personal care plan. Depending upon the condition, your plan could have included both over the counter or prescription medications. ? ?Please review your pharmacy choice. Make sure the  pharmacy is open so you can pick up prescription now. If there is a problem, you may contact your provider through MyChart messaging and have the prescription routed to another pharmacy.  Your safety is important to us. If you have drug allergies check your prescription carefully.  ? ?For the next 24 hours you can use MyChart to ask questions about today's visit, request a non-urgent call back, or ask for a work or school excuse. ?You will get an email in the next two days asking about your experience. I hope that your e-visit has been valuable and will speed your recovery. ? ?

## 2023-03-01 ENCOUNTER — Other Ambulatory Visit: Payer: Self-pay

## 2023-03-01 MED ORDER — DICLOXACILLIN SODIUM 500 MG PO CAPS
500.0000 mg | ORAL_CAPSULE | Freq: Three times a day (TID) | ORAL | 0 refills | Status: DC
  Filled 2023-03-01: qty 21, 7d supply, fill #0

## 2023-03-05 ENCOUNTER — Ambulatory Visit: Payer: 59 | Admitting: Podiatry

## 2023-04-25 ENCOUNTER — Other Ambulatory Visit: Payer: Self-pay

## 2023-04-25 MED ORDER — FLULAVAL 0.5 ML IM SUSY
0.5000 mL | PREFILLED_SYRINGE | Freq: Once | INTRAMUSCULAR | 0 refills | Status: AC
  Filled 2023-04-25: qty 0.5, 1d supply, fill #0

## 2023-06-02 ENCOUNTER — Telehealth: Payer: 59

## 2023-06-02 DIAGNOSIS — H1032 Unspecified acute conjunctivitis, left eye: Secondary | ICD-10-CM | POA: Diagnosis not present

## 2023-06-03 MED ORDER — POLYMYXIN B-TRIMETHOPRIM 10000-0.1 UNIT/ML-% OP SOLN: 1.0000 [drp] | OPHTHALMIC | 0 refills | Status: DC

## 2023-06-03 MED ORDER — POLYMYXIN B-TRIMETHOPRIM 10000-0.1 UNIT/ML-% OP SOLN
1.0000 [drp] | OPHTHALMIC | 0 refills | Status: AC
  Filled 2023-06-04: qty 10, 7d supply, fill #0

## 2023-06-03 NOTE — Progress Notes (Signed)
E-Visit for Pink Eye   We are sorry that you are not feeling well.  Here is how we plan to help!  Based on what you have shared with me it looks like you have conjunctivitis.  Conjunctivitis is a common inflammatory or infectious condition of the eye that is often referred to as "pink eye".  In most cases it is contagious (viral or bacterial). However, not all conjunctivitis requires antibiotics (ex. Allergic).  We have made appropriate suggestions for you based upon your presentation.  I have prescribed Polytrim Ophthalmic drops 1-2 drops 4 times a day times 5 days  Pink eye can be highly contagious.  It is typically spread through direct contact with secretions, or contaminated objects or surfaces that one may have touched.  Strict handwashing is suggested with soap and water is urged.  If not available, use alcohol based had sanitizer.  Avoid unnecessary touching of the eye.  If you wear contact lenses, you will need to refrain from wearing them until you see no white discharge from the eye for at least 24 hours after being on medication.  You should see symptom improvement in 1-2 days after starting the medication regimen.  Call us if symptoms are not improved in 1-2 days.  Home Care: Wash your hands often! Do not wear your contacts until you complete your treatment plan. Avoid sharing towels, bed linen, personal items with a person who has pink eye. See attention for anyone in your home with similar symptoms.  Get Help Right Away If: Your symptoms do not improve. You develop blurred or loss of vision. Your symptoms worsen (increased discharge, pain or redness)   Thank you for choosing an e-visit.  Your e-visit answers were reviewed by a board certified advanced clinical practitioner to complete your personal care plan. Depending upon the condition, your plan could have included both over the counter or prescription medications.  Please review your pharmacy choice. Make sure the  pharmacy is open so you can pick up prescription now. If there is a problem, you may contact your provider through MyChart messaging and have the prescription routed to another pharmacy.  Your safety is important to us. If you have drug allergies check your prescription carefully.   For the next 24 hours you can use MyChart to ask questions about today's visit, request a non-urgent call back, or ask for a work or school excuse. You will get an email in the next two days asking about your experience. I hope that your e-visit has been valuable and will speed your recovery.     have provided 5 minutes of non face to face time during this encounter for chart review and documentation.   

## 2023-06-04 ENCOUNTER — Other Ambulatory Visit: Payer: Self-pay

## 2023-06-04 ENCOUNTER — Ambulatory Visit: Payer: 59

## 2023-06-04 MED ORDER — LIDOCAINE VISCOUS HCL 2 % MT SOLN
5.0000 mL | Freq: Three times a day (TID) | OROMUCOSAL | 0 refills | Status: DC
  Filled 2023-06-04: qty 100, 7d supply, fill #0

## 2023-06-04 MED ORDER — AMOXICILLIN 875 MG PO TABS
875.0000 mg | ORAL_TABLET | Freq: Two times a day (BID) | ORAL | 0 refills | Status: DC
  Filled 2023-06-04: qty 14, 7d supply, fill #0

## 2023-06-05 ENCOUNTER — Telehealth: Payer: 59 | Admitting: Physician Assistant

## 2023-06-05 ENCOUNTER — Other Ambulatory Visit: Payer: Self-pay

## 2023-06-05 DIAGNOSIS — J020 Streptococcal pharyngitis: Secondary | ICD-10-CM

## 2023-06-05 NOTE — Progress Notes (Signed)

## 2023-06-17 ENCOUNTER — Ambulatory Visit
Admission: EM | Admit: 2023-06-17 | Discharge: 2023-06-17 | Disposition: A | Discharge status: Home / Self Care | Payer: 59 | Attending: Emergency Medicine | Admitting: Emergency Medicine

## 2023-06-17 DIAGNOSIS — J039 Acute tonsillitis, unspecified: Secondary | ICD-10-CM | POA: Diagnosis not present

## 2023-06-17 LAB — POCT RAPID STREP A (OFFICE): Rapid Strep A Screen: NEGATIVE

## 2023-06-17 MED ORDER — CEFDINIR 300 MG PO CAPS: 300.0000 mg | ORAL_CAPSULE | Freq: Two times a day (BID) | ORAL | 0 refills | Status: AC

## 2023-06-17 NOTE — Discharge Instructions (Addendum)
Your evaluated for your sore throat  Strep testing today is negative, has been sent to the lab to see if bacteria will grow, you will be notified if this occurs  On exam your tonsils are enlarged but there are no Tashai Catino patches or redness  Begin cefdinir every morning and every evening for 10 days as you did see improvement with use of's some amoxicillin  May gargle and spit salt water, Listerine,   may continue Tylenol and or Motrin as needed for pain  If your symptoms continue to persist you may follow-up with ear nose and throat specialist for reevaluation

## 2023-06-17 NOTE — ED Provider Notes (Signed)
Renaldo Fiddler    CSN: [id number] Arrival date & time: 06/17/23  4098      History   Chief Complaint Chief Complaint  Patient presents with   Sore Throat    HPI Jordan Jackson is a 27 y.o. female.   Patient presents for evaluation of a sore throat present for 2 weeks.  Associated left-sided ear pain.  Associated fever resolved after 1 week.  Painful to swallow but has been able to tolerate food and liquids.  Did e-visit completed amoxicillin, did see improvement but symptoms did not fully resolve.  Currently breast-feeding.  Denies ear pain, congestion cough.  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   RUQ pain 12/08/2016   Alternating constipation and diarrhea 12/08/2016   GERD (gastroesophageal reflux disease) 12/08/2016   Fatty liver 10/24/2016   Irregular periods/menstrual cycles 08/10/2015   Menstrual cramps 08/10/2015    Past Surgical History:  Procedure Laterality Date   BIOPSY  12/26/2016   Procedure: BIOPSY;  Surgeon: West Bali, MD;  [facility];  Service: Endoscopy;;  gastric duodenal   COLONOSCOPY N/A 12/26/2016   Procedure: COLONOSCOPY;  Surgeon: West Bali, MD;  [facility]: [facility];  Service: Endoscopy;  Laterality: N/A;  1:00pm   ESOPHAGOGASTRODUODENOSCOPY N/A 12/26/2016   Procedure: ESOPHAGOGASTRODUODENOSCOPY (EGD);  Surgeon: West Bali, MD;  [facility]: [facility];  Service: Endoscopy;   Laterality: N/A;   NO PAST SURGERIES      OB History     Gravida  1   Para  1   Term  1   Preterm  0   AB  0   Living  1      SAB  0   IAB  0   Ectopic  0   Multiple  0   Live Births  1            Home Medications    Prior to Admission medications   Medication Sig Start Date End Date Taking? Authorizing Provider  amoxicillin (AMOXIL) 875 MG tablet Take 1 tablet (875 mg total) by mouth 2 (two) times daily. 06/03/23  Yes   cefdinir (OMNICEF) 300 MG capsule Take 1 capsule (300 mg total) by mouth 2 (two) times daily for 10 days. 06/17/23 06/27/23 Yes Lynelle Weiler R, NP  lidocaine (XYLOCAINE) 2 % solution Use as directed 5 mLs in the mouth or throat 3 (three) times daily. 06/03/23     NIFEdipine (PROCARDIA XL) 30 MG 24 hr tablet Take 1 tablet (30 mg total) by mouth daily as directed 10/31/22       Family History Family History  Problem Relation Age of Onset   Hypertension Mother    Arthritis Mother    Hypertension Father    Diabetes Maternal Grandmother    Hypertension Paternal Grandmother    Hypertension Paternal Grandfather    Diabetes Paternal Grandfather    Inflammatory bowel disease Neg Hx    Liver disease  Neg Hx    Celiac disease Neg Hx    Colon cancer Neg Hx     Social History Social History   Tobacco Use   Smoking status: Never   Smokeless tobacco: Never  Vaping Use   Vaping status: Never Used  Substance Use Topics   Alcohol use: Not Currently   Drug use: No     Allergies   Patient has no known allergies.   Review of Systems Review of Systems   Physical Exam Triage Vital Signs ED Triage Vitals  Encounter Vitals Group     BP 06/17/23 1025 126/87     Systolic BP Percentile --      Diastolic BP Percentile --      Pulse Rate 06/17/23 1022 89     Resp 06/17/23 1025 16     Temp 06/17/23 1022 98 F (36.7 C)     Temp Source 06/17/23 1022 Oral     SpO2 06/17/23 1025 96 %     Weight --      Height --      Head Circumference --       Peak Flow --      Pain Score 06/17/23 1025 3     Pain Loc --      Pain Education --      Exclude from Growth Chart --    No data found.  Updated Vital Signs BP 126/87 (BP Location: Left Arm)   Pulse 89   Temp 98 F (36.7 C) (Oral)   Resp 16   SpO2 96%   Breastfeeding Yes   Visual Acuity Right Eye Distance:   Left Eye Distance:   Bilateral Distance:    Right Eye Near:   Left Eye Near:    Bilateral Near:     Physical Exam Constitutional:      Appearance: She is well-developed.  HENT:     Head: Normocephalic.     Right Ear: Tympanic membrane and ear canal normal.     Left Ear: Tympanic membrane and ear canal normal.     Nose: No congestion or rhinorrhea.     Mouth/Throat:     Pharynx: No posterior oropharyngeal erythema.     Tonsils: No tonsillar exudate. 3+ on the right. 3+ on the left.  Cardiovascular:     Rate and Rhythm: Normal rate and regular rhythm.     Heart sounds: Normal heart sounds.  Pulmonary:     Effort: Pulmonary effort is normal.     Breath sounds: Normal breath sounds.  Musculoskeletal:     Cervical back: Normal range of motion.  Lymphadenopathy:     Cervical: Cervical adenopathy present.  Neurological:     General: No focal deficit present.     Mental Status: She is alert and oriented to person, place, and time.      UC Treatments / Results  Labs (all labs ordered are listed, but only abnormal results are displayed) Labs Reviewed  POCT RAPID STREP A (OFFICE) - Normal  CULTURE, GROUP A STREP [facility])    EKG   Radiology No results found.  Procedures Procedures (including critical care time)  Medications Ordered in UC Medications - No data to display  Initial Impression / Assessment and Plan / UC Course  I have reviewed the triage vital signs and the nursing notes.  Pertinent labs & imaging results that were available during my care of the patient were reviewed by me and considered in my medical decision making (see chart  for  details).  Acute tonsillitis  Vitals are stable, patient is in no signs of distress on exam, stable for outpatient management, tonsillar adenopathy without exudate or erythema noted on exam, rapid strep test negative, sent for culture, some improvement seen with antibiotic use, prescribed cefdinir, recommended additional supportive care, advised follow-up with her nose and throat symptoms continue Final Clinical Impressions(s) / UC Diagnoses   Final diagnoses:  Acute tonsillitis, unspecified etiology     Discharge Instructions      Your evaluated for your sore throat  Strep testing today is negative, has been sent to the lab to see if bacteria will grow, you will be notified if this occurs  On exam your tonsils are enlarged but there are no [place] Enke patches or redness  Begin cefdinir every morning and every evening for 10 days as you did see improvement with use of's some amoxicillin  May gargle and spit salt water, Listerine,   may continue Tylenol and or Motrin as needed for pain  If your symptoms continue to persist you may follow-up with ear nose and throat specialist for reevaluation   ED Prescriptions     Medication Sig Dispense Auth. Provider   cefdinir (OMNICEF) 300 MG capsule Take 1 capsule (300 mg total) by mouth 2 (two) times daily for 10 days. 20 capsule Valinda Hoar, NP      PDMP not reviewed this encounter.   Valinda Hoar, NP 06/17/23 1057

## 2023-06-17 NOTE — ED Triage Notes (Signed)
Sore throat x 2 weeks. Took amoxicillin she was prescribed from e-visit just finished them a week ago and did feel better while on antibiotics then symptoms came back 4 days ago.   Pt is currently breastfeeding.

## 2023-06-20 LAB — CULTURE, GROUP A STREP (THRC)

## 2023-09-18 ENCOUNTER — Encounter: Payer: Self-pay | Admitting: Physician Assistant

## 2023-09-18 ENCOUNTER — Ambulatory Visit (INDEPENDENT_AMBULATORY_CARE_PROVIDER_SITE_OTHER): Payer: Self-pay | Admitting: Physician Assistant

## 2023-09-18 ENCOUNTER — Other Ambulatory Visit: Payer: Self-pay

## 2023-09-18 VITALS — BP 140/100 | HR 87 | Temp 98.1°F | Ht 63.0 in | Wt 256.4 lb

## 2023-09-18 DIAGNOSIS — F419 Anxiety disorder, unspecified: Secondary | ICD-10-CM | POA: Diagnosis not present

## 2023-09-18 DIAGNOSIS — R030 Elevated blood-pressure reading, without diagnosis of hypertension: Secondary | ICD-10-CM | POA: Diagnosis not present

## 2023-09-18 MED ORDER — NIFEDIPINE ER OSMOTIC RELEASE 30 MG PO TB24
30.0000 mg | ORAL_TABLET | Freq: Every day | ORAL | 1 refills | Status: DC
  Filled 2023-09-18 – 2024-02-01 (×2): qty 90, 90d supply, fill #0

## 2023-09-18 MED ORDER — SERTRALINE HCL 25 MG PO TABS
25.0000 mg | ORAL_TABLET | Freq: Every day | ORAL | 1 refills | Status: DC
  Filled 2023-09-18: qty 30, 30d supply, fill #0
  Filled 2023-10-30: qty 30, 30d supply, fill #1

## 2023-09-18 NOTE — Patient Instructions (Signed)
 It was great to see you!  Your blood pressure is elevated in our office today.  I recommend that you monitor this at home.  Your goal blood pressure should be around < 130/80, unless you are over 29 years old, your goal may be closer to 140-150/90. Please note if you have been given other goals from a cardiologist or other healthcare provider, please defer to their recommendations.  When preparing to take your blood pressure: Plan ahead. Don't smoke, drink caffeine or exercise within 30 minutes before taking your blood pressure. Empty your bladder. Don't take the measurement over clothes. Remove the clothing over the arm that will be used to measure blood pressure. You can use either arm unless otherwise told by a healthcare provider. Usually there is not a big difference between readings on them. Be still. Allow at least five minutes of quiet rest before measurements. Don't talk or use the phone. Sit correctly. Sit with your back straight and supported (on a dining chair, rather than a sofa). Your feet should be flat on the floor. Do not cross your legs. Support your arm on a flat surface. The middle of the cuff should be placed on the upper arm at heart level.  Measure at the same time of the day. Take multiple readings and record the results. Each time you measure, take two readings one minute apart. Record the results and bring in to your next office visit.  In order to know how well the medication is working, I would like you to take your readings 1-2 hours after taking your blood pressure medication if possible. Take your blood pressure measurements and record 2-3 days per week.  If you get a high blood pressure reading: A single high reading is not an immediate cause for alarm. If you get a reading that is higher than normal, take your blood pressure a second time. Write down the results of both measurements. Check with your health care professional to see if there's a health concern or  whether there may be problems with your monitor. If your blood pressure readings are suddenly higher than 180/120 mm Hg, wait at least one minute and test again. If your readings are still very high, contact your health care professional immediately. You could be having a hypertensive crisis. Call 911 if your blood pressure is higher than 180/120 mm Hg and if you are having new signs or symptoms that may include: Chest pain Shortness of breath Back pain Numbness Weakness Change in vision Difficulty speaking Confusion Dizziness Vomiting  Take care,  Jarold Motto PA-C

## 2023-09-18 NOTE — Progress Notes (Signed)
 Jordan Jackson is a 28 y.o. female here for a new problem.  History of Present Illness:   Chief Complaint  Patient presents with   Anxiety    Pt c/o increase in anxiety the past 6 months.    HPI  Anxiety // Moods: Pt reports increased social anxiety. She has been working as a Nurse, learning disability for about 6 years.  She recently switched shifts, from nights to days.  Since working days she has been around more people, which triggers her anxiety.  She endorses hand trembling, forgetfulness, and a sensitivity to sounds while in the car (as a passenger).  Reports a lack of motivation while at home after work.  At times has intrusive thoughts and thinks of the "worst case scenarios". She adds that she avoids watching her son on the home camera while on her lunch break due to these intrusive thoughts. She has considered going back to working nights due to the stress and anxiety from working days. Has not taken any SSRIs in the past.  HTN: Has not been taking Procardia, states she missed a few doses and just stopped.  She does monitor her BP at home on occasion.  She checked it last week when she had headaches.   Past Medical History:  Diagnosis Date   Fatty liver 10/24/2016   Refer to GI   Gastritis    easinophilic    GERD (gastroesophageal reflux disease)    Irregular periods/menstrual cycles 08/10/2015   Menstrual cramps 08/10/2015   Pelvic pain in female      Social History   Tobacco Use   Smoking status: Never   Smokeless tobacco: Never  Vaping Use   Vaping status: Never Used  Substance Use Topics   Alcohol use: Not Currently   Drug use: No    Past Surgical History:  Procedure Laterality Date   BIOPSY  12/26/2016   Procedure: BIOPSY;  Surgeon: West Bali, MD;  [facility];  Service: Endoscopy;;  gastric duodenal   COLONOSCOPY N/A 12/26/2016   Procedure: COLONOSCOPY;  Surgeon: West Bali, MD;  [facility];  Service: Endoscopy;   Laterality: N/A;  1:00pm   ESOPHAGOGASTRODUODENOSCOPY N/A 12/26/2016   Procedure: ESOPHAGOGASTRODUODENOSCOPY (EGD);  Surgeon: West Bali, MD;  [facility];  Service: Endoscopy;  Laterality: N/A;   NO PAST SURGERIES      Family History  Problem Relation Age of Onset   Hypertension Mother    Arthritis Mother    Hypertension Father    Diabetes Maternal Grandmother    Hypertension Paternal Grandmother    Hypertension Paternal Grandfather    Diabetes Paternal Grandfather    Inflammatory bowel disease Neg Hx    Liver disease Neg Hx    Celiac disease Neg Hx    Colon cancer Neg Hx     No Known Allergies  Current Medications:   Current Outpatient Medications:    levonorgestrel (MIRENA, 52 MG,) 20 MCG/DAY IUD, 1 each by Intrauterine route once. Inserted by GYN 11/2022, needs to be removed 11/2030, Disp: , Rfl:    sertraline (ZOLOFT) 25 MG tablet, Take 1 tablet (25 mg total) by mouth daily., Disp: 30 tablet, Rfl: 1   NIFEdipine (PROCARDIA XL) 30 MG 24 hr tablet, Take 1 tablet (30 mg total) by mouth daily as directed, Disp: 90 tablet, Rfl: 1   Review of Systems:   Negative unless otherwise specified per HPI.  Vitals:   Vitals:   09/18/23 1124  BP: (!) 140/100  Pulse: 87  Temp: 98.1 F (36.7 C)  TempSrc: Temporal  SpO2: 98%  Weight: 256 lb 6.1 oz (116.3 kg)  Height: 5\' 3"  (1.6 m)     Body mass index is 45.42 kg/m.  Physical Exam:   Physical Exam Vitals and nursing note reviewed.  Constitutional:      General: She is not in acute distress.    Appearance: She is well-developed. She is not ill-appearing or toxic-appearing.  Cardiovascular:     Rate and Rhythm: Normal rate and regular rhythm.     Pulses: Normal pulses.     Heart sounds: Normal heart sounds, S1 normal and S2 normal.  Pulmonary:     Effort: Pulmonary effort is normal.     Breath sounds: Normal breath sounds.  Skin:    General: Skin is warm and dry.  Neurological:     Mental Status: She  is alert.     GCS: GCS eye subscore is 4. GCS verbal subscore is 5. GCS motor subscore is 6.  Psychiatric:        Speech: Speech normal.        Behavior: Behavior normal. Behavior is cooperative.     Assessment and Plan:   1. Elevated blood pressure reading (Primary) Above goal today No evidence of end-organ damage on my exam Recommend patient monitor home blood pressure at least a few times weekly Recommend restart nifedepine 30 mg xl daily If home monitoring shows consistent elevation, or any symptom(s) develop, recommend reach out to [place] for further advice on next steps  2. Anxiety Uncontrolled Reviewed risks and benefits/side effect(s)  Start Zoloft 25 mg daily  Follow-up in 2-3 months for Comprehensive Physical Exam (CPE) preventive care annual visit, sooner if concerns I discussed with patient that if they develop any SI, to tell someone immediately and seek medical attention.   I, Isabelle Course, acting as a Neurosurgeon for Jarold Motto, [place]., have documented all relevant documentation on the behalf of Jarold Motto, [place], as directed by  Jarold Motto, PA while in the presence of Jarold Motto, [place].  I, Jarold Motto, [place], have reviewed all documentation for this visit. The documentation on 09/18/23 for the exam, diagnosis, procedures, and orders are all accurate and complete.  Jarold Motto, PA-C

## 2023-09-19 ENCOUNTER — Other Ambulatory Visit: Payer: Self-pay

## 2023-10-30 ENCOUNTER — Encounter: Payer: Self-pay | Admitting: Physician Assistant

## 2023-11-12 ENCOUNTER — Ambulatory Visit: Admitting: Physician Assistant

## 2023-11-16 ENCOUNTER — Ambulatory Visit: Payer: Self-pay | Admitting: Physician Assistant

## 2023-11-16 ENCOUNTER — Other Ambulatory Visit: Payer: Self-pay

## 2023-11-16 ENCOUNTER — Ambulatory Visit: Admitting: Physician Assistant

## 2023-11-16 ENCOUNTER — Encounter: Payer: Self-pay | Admitting: Physician Assistant

## 2023-11-16 VITALS — BP 120/80 | HR 78 | Temp 97.5°F | Ht 63.0 in | Wt 258.0 lb

## 2023-11-16 DIAGNOSIS — E88819 Insulin resistance, unspecified: Secondary | ICD-10-CM

## 2023-11-16 DIAGNOSIS — Z136 Encounter for screening for cardiovascular disorders: Secondary | ICD-10-CM

## 2023-11-16 DIAGNOSIS — J0110 Acute frontal sinusitis, unspecified: Secondary | ICD-10-CM | POA: Diagnosis not present

## 2023-11-16 DIAGNOSIS — R635 Abnormal weight gain: Secondary | ICD-10-CM

## 2023-11-16 DIAGNOSIS — R1013 Epigastric pain: Secondary | ICD-10-CM

## 2023-11-16 DIAGNOSIS — Z13220 Encounter for screening for lipoid disorders: Secondary | ICD-10-CM | POA: Diagnosis not present

## 2023-11-16 LAB — CBC WITH DIFFERENTIAL/PLATELET
Basophils Absolute: 0.1 10*3/uL (ref 0.0–0.1)
Basophils Relative: 1.1 % (ref 0.0–3.0)
Eosinophils Absolute: 0.5 10*3/uL (ref 0.0–0.7)
Eosinophils Relative: 11 % — ABNORMAL HIGH (ref 0.0–5.0)
HCT: 39.5 % (ref 36.0–46.0)
Hemoglobin: 13.2 g/dL (ref 12.0–15.0)
Lymphocytes Relative: 26.5 % (ref 12.0–46.0)
Lymphs Abs: 1.3 10*3/uL (ref 0.7–4.0)
MCHC: 33.5 g/dL (ref 30.0–36.0)
MCV: 86.3 fl (ref 78.0–100.0)
Monocytes Absolute: 0.8 10*3/uL (ref 0.1–1.0)
Monocytes Relative: 15.9 % — ABNORMAL HIGH (ref 3.0–12.0)
Neutro Abs: 2.2 10*3/uL (ref 1.4–7.7)
Neutrophils Relative %: 45.5 % (ref 43.0–77.0)
Platelets: 377 10*3/uL (ref 150.0–400.0)
RBC: 4.57 Mil/uL (ref 3.87–5.11)
RDW: 14.2 % (ref 11.5–15.5)
WBC: 4.9 10*3/uL (ref 4.0–10.5)

## 2023-11-16 LAB — COMPREHENSIVE METABOLIC PANEL WITH GFR
ALT: 17 U/L (ref 0–35)
AST: 19 U/L (ref 0–37)
Albumin: 4.5 g/dL (ref 3.5–5.2)
Alkaline Phosphatase: 107 U/L (ref 39–117)
BUN: 11 mg/dL (ref 6–23)
CO2: 28 meq/L (ref 19–32)
Calcium: 9.3 mg/dL (ref 8.4–10.5)
Chloride: 105 meq/L (ref 96–112)
Creatinine, Ser: 0.58 mg/dL (ref 0.40–1.20)
GFR: 122.61 mL/min (ref >60.00)
Glucose, Bld: 84 mg/dL (ref 70–99)
Potassium: 4 meq/L (ref 3.5–5.1)
Sodium: 139 meq/L (ref 135–145)
Total Bilirubin: 0.3 mg/dL (ref 0.2–1.2)
Total Protein: 8.2 g/dL (ref 6.0–8.3)

## 2023-11-16 LAB — LIPASE: Lipase: 17 U/L (ref 11.0–59.0)

## 2023-11-16 LAB — LIPID PANEL
Cholesterol: 162 mg/dL (ref 0–200)
HDL: 54.1 mg/dL (ref >39.00)
LDL Cholesterol: 99 mg/dL (ref 0–99)
NonHDL: 108.27
Total CHOL/HDL Ratio: 3
Triglycerides: 48 mg/dL (ref 0.0–149.0)
VLDL: 9.6 mg/dL (ref 0.0–40.0)

## 2023-11-16 LAB — HEMOGLOBIN A1C: Hgb A1c MFr Bld: 5.7 % (ref 4.6–6.5)

## 2023-11-16 LAB — TSH: TSH: 1.42 u[IU]/mL (ref 0.35–5.50)

## 2023-11-16 MED ORDER — PANTOPRAZOLE SODIUM 40 MG PO TBEC
40.0000 mg | DELAYED_RELEASE_TABLET | Freq: Every day | ORAL | 3 refills | Status: AC
  Filled 2023-11-16: qty 30, 30d supply, fill #0

## 2023-11-16 MED ORDER — AMOXICILLIN-POT CLAVULANATE 875-125 MG PO TABS
1.0000 | ORAL_TABLET | Freq: Two times a day (BID) | ORAL | 0 refills | Status: DC
  Filled 2023-11-16: qty 14, 7d supply, fill #0

## 2023-11-16 NOTE — Progress Notes (Signed)
 Jordan Jackson is a 28 y.o. female here for a new problem.  History of Present Illness:   Chief Complaint  Patient presents with   Abdominal Pain    Upper middle part; x3wks, comes after sitting for a while hard to stretch out when standing; dull achy pain; no difference when eating or using the bathroom; no injury   Weight Loss    Want to discuss medication to help   Abdominal pain:  Pt complains of dull upper abdominal pain and tenderness, ongoing 3 weeks. When standing up after sitting for long periods of time she feels it "tightens up" and causes her to bend over for a few minutes to stretch until her pain improves.  Also experiences pain when lying on her abdomen.  Reports tenderness to palpation.  She does not believe it feels like a hernia.  Has not followed up with GI in years for IBS, GERD, and eosinophilic gastritis.  At baseline she experiences acid reflux a couple times a week or with trigger foods.  She manages this with Pepcid  once daily as needed.  Pt does have IUD placed and denies any chance of pregnancy.  Denies any constipation, stool color changes, blood in stool, or nausea.  Weight management: Pt has struggled with weight loss despite her best efforts.  Does not closely monitor her blood sugars.  Pt reports GI side effects with Metformin .   Sinus issues: Pt has been experiencing sinus symptoms after recent exposure to similar symptoms from her son.  She has tried multiple OTC (available over the counter without a prescription) medications without relief She has sinus pressure, nasal congestion, slight cough No fevers/chills    Past Medical History:  Diagnosis Date   Fatty liver 10/24/2016   Refer to GI   Gastritis    easinophilic    GERD (gastroesophageal reflux disease)    Irregular periods/menstrual cycles 08/10/2015   Menstrual cramps 08/10/2015   Pelvic pain in female      Social History   Tobacco Use   Smoking status: Never   Smokeless  tobacco: Never  Vaping Use   Vaping status: Never Used  Substance Use Topics   Alcohol use: Not Currently   Drug use: No    Past Surgical History:  Procedure Laterality Date   BIOPSY  12/26/2016   Procedure: BIOPSY;  Surgeon: Alyce Jubilee, MD;  [facility];  Service: Endoscopy;;  gastric duodenal   COLONOSCOPY N/A 12/26/2016   Procedure: COLONOSCOPY;  Surgeon: Alyce Jubilee, MD;  [facility];  Service: Endoscopy;  Laterality: N/A;  1:00pm   ESOPHAGOGASTRODUODENOSCOPY N/A 12/26/2016   Procedure: ESOPHAGOGASTRODUODENOSCOPY (EGD);  Surgeon: Alyce Jubilee, MD;  [facility];  Service: Endoscopy;  Laterality: N/A;   NO PAST SURGERIES      Family History  Problem Relation Age of Onset   Hypertension Mother    Arthritis Mother    Hypertension Father    Diabetes Maternal Grandmother    Hypertension Paternal Grandmother    Hypertension Paternal Grandfather    Diabetes Paternal Grandfather    Inflammatory bowel disease Neg Hx    Liver disease Neg Hx    Celiac disease Neg Hx    Colon cancer Neg Hx     No Known Allergies  Current Medications:   Current Outpatient Medications:    amoxicillin -clavulanate (AUGMENTIN) 875-125 MG tablet, Take 1 tablet by mouth 2 (two) times daily., Disp: 14 tablet, Rfl: 0   famotidine  (PEPCID )  10 MG tablet, Take 10 mg by mouth daily as needed for heartburn or indigestion., Disp: , Rfl:    levonorgestrel  (MIRENA , 52 MG,) 20 MCG/DAY IUD, 1 each by Intrauterine route once. Inserted by GYN 11/2022, needs to be removed 11/2030, Disp: , Rfl:    NIFEdipine  (PROCARDIA  XL) 30 MG 24 hr tablet, Take 1 tablet (30 mg total) by mouth daily as directed, Disp: 90 tablet, Rfl: 1   pantoprazole  (PROTONIX ) 40 MG tablet, Take 1 tablet (40 mg total) by mouth daily., Disp: 30 tablet, Rfl: 3   sertraline  (ZOLOFT ) 25 MG tablet, Take 1 tablet (25 mg total) by mouth daily., Disp: 30 tablet, Rfl: 1   Review of Systems:   Negative unless  otherwise specified per HPI.  Vitals:   Vitals:   11/16/23 1115  BP: 120/80  Pulse: 78  Temp: (!) 97.5 F (36.4 C)  TempSrc: Temporal  SpO2: 98%  Weight: 258 lb (117 kg)  Height: 5\' 3"  (1.6 m)     Body mass index is 45.7 kg/m.  Physical Exam:   Physical Exam Vitals and nursing note reviewed.  Constitutional:      General: She is not in acute distress.    Appearance: She is well-developed. She is not ill-appearing or toxic-appearing.  HENT:     Nose:     Right Sinus: Frontal sinus tenderness present.     Left Sinus: Frontal sinus tenderness present.  Cardiovascular:     Rate and Rhythm: Normal rate and regular rhythm.     Pulses: Normal pulses.     Heart sounds: Normal heart sounds, S1 normal and S2 normal.  Pulmonary:     Effort: Pulmonary effort is normal.     Breath sounds: Normal breath sounds.  Abdominal:     General: Abdomen is flat. Bowel sounds are normal.     Palpations: Abdomen is soft.     Tenderness: There is abdominal tenderness in the epigastric area. There is no guarding or rebound. Negative signs include Murphy's sign and Rovsing's sign.  Skin:    General: Skin is warm and dry.  Neurological:     Mental Status: She is alert.     GCS: GCS eye subscore is 4. GCS verbal subscore is 5. GCS motor subscore is 6.  Psychiatric:        Speech: Speech normal.        Behavior: Behavior normal. Behavior is cooperative.     Assessment and Plan:   1. Acute non-recurrent frontal sinusitis (Primary) No red flags on exam.   Will initiate augmentin per orders.  Discussed taking medications as prescribed.  Reviewed return precautions including new or worsening fever, SOB, new or worsening cough or other concerns.  Push fluids and rest.  I recommend that patient follow-up if symptoms worsen or persist despite treatment x 7-10 days, sooner if needed.  2. Epigastric pain No red flags on exam -- no evidence of acute abdomen on my exam warranting urgent  evaluation Differential diagnosis includes, but is not limited to: gastritis, peptic ulcer disease, pancreatitis, musculoskeletal strain, constipation, IBS, hernia Will update blood work Will trial protonix  40 mg daily -- if no improvement, I asked her to reach out and we will either refer back to gastrointestinal or consider abdominal imaging  - Lipid panel - Lipase  3. Weight gain Update blood work Limited medication options due to insurance Discussed possibility of starting contrave vs referral to [facility] She would like to assess blood work results and  go from there Continue efforts at healthy lifestyle - CBC with Differential/Platelet - Comprehensive metabolic panel with GFR - TSH  4. Insulin resistance Update today and provide recommendations Cannot tolerate metformin  due to IBS issues in the past - Hemoglobin A1c  5. Encounter for lipid screening for cardiovascular disease Update lipid panel and provide recommendations   I, Bernita Bristle, acting as a scribe for Alexander Iba, [place]., have documented all relevant documentation on the behalf of Alexander Iba, [place], as directed by   while in the presence of Alexander Iba, [place].  I, Alexander Iba, [place], have reviewed all documentation for this visit. The documentation on 11/16/23 for the exam, diagnosis, procedures, and orders are all accurate and complete.  Alexander Iba, PA-C

## 2023-11-16 NOTE — Patient Instructions (Signed)
 It was great to see you!  Start Augmentin antibiotic(s) for your sinus infection  Start protonix  40 mg daily to see if this helps your symptom(s) -- if it does not, let me know and I will advise on next steps  Consider Contrave (see handout) vs Referral to [facility] --- they do great things with weight management   Let's follow-up in 3 month(s), sooner if you have concerns.  Take care,  Alexander Iba PA-C

## 2023-11-28 ENCOUNTER — Encounter: Admitting: Physician Assistant

## 2023-12-21 ENCOUNTER — Telehealth

## 2023-12-21 DIAGNOSIS — J029 Acute pharyngitis, unspecified: Secondary | ICD-10-CM | POA: Diagnosis not present

## 2023-12-22 MED ORDER — LIDOCAINE VISCOUS HCL 2 % MT SOLN: 5.0000 mL | OROMUCOSAL | 0 refills | Status: DC | PRN

## 2023-12-22 NOTE — Progress Notes (Signed)
  E-Visit for Sore Throat  We are sorry that you are not feeling well.  Here is how we plan to help!  Your symptoms indicate a likely viral infection (Pharyngitis).   Pharyngitis is inflammation in the back of the throat which can cause a sore throat, scratchiness and sometimes difficulty swallowing.   Pharyngitis is typically caused by a respiratory virus and will just run its course.  Please keep in mind that your symptoms could last up to 10 days.  For throat pain, we recommend over the counter oral pain relief medications such as acetaminophen  or aspirin, or anti-inflammatory medications such as ibuprofen  or naproxen sodium.  Topical treatments such as oral throat lozenges or sprays may be used as needed.  I have sent viscous lidocaine  for prn use if your sore throat is affecting your work.   Avoid close contact with loved ones, especially the very young and elderly.  Remember to wash your hands thoroughly throughout the day as this is the number one way to prevent the spread of infection and wipe down door knobs and counters with disinfectant.  After careful review of your answers, I would not recommend an antibiotic for your condition.  Antibiotics should not be used to treat conditions that we suspect are caused by viruses like the virus that causes the common cold or flu. However, some people can have Strep with atypical symptoms. You may need formal testing in clinic or office to confirm if your symptoms continue or worsen.  Providers prescribe antibiotics to treat infections caused by bacteria. Antibiotics are very powerful in treating bacterial infections when they are used properly.  To maintain their effectiveness, they should be used only when necessary.  Overuse of antibiotics has resulted in the development of super bugs that are resistant to treatment!    Home Care: Only take medications as instructed by your medical team. Do not drink alcohol while taking these medications. A steam  or ultrasonic humidifier can help congestion.  You can place a towel over your head and breathe in the steam from hot water  coming from a faucet. Avoid close contacts especially the very young and the elderly. Cover your mouth when you cough or sneeze. Always remember to wash your hands.  Get Help Right Away If: You develop worsening fever or throat pain. You develop a severe head ache or visual changes. Your symptoms persist after you have completed your treatment plan.  Make sure you Understand these instructions. Will watch your condition. Will get help right away if you are not doing well or get worse.   Thank you for choosing an e-visit.  Your e-visit answers were reviewed by a board certified advanced clinical practitioner to complete your personal care plan. Depending upon the condition, your plan could have included both over the counter or prescription medications.  Please review your pharmacy choice. Make sure the pharmacy is open so you can pick up prescription now. If there is a problem, you may contact your provider through [employer] and have the prescription routed to another pharmacy.  Your safety is important to us . If you have drug allergies check your prescription carefully.   For the next 24 hours you can use MyChart to ask questions about today's visit, request a non-urgent call back, or ask for a work or school excuse. You will get an email in the next two days asking about your experience. I hope that your e-visit has been valuable and will speed your recovery.

## 2023-12-22 NOTE — Progress Notes (Signed)
 I have spent 5 minutes in review of e-visit questionnaire, review and updating patient chart, medical decision making and response to patient.   Claiborne Rigg, NP

## 2024-02-01 ENCOUNTER — Other Ambulatory Visit: Payer: Self-pay

## 2024-02-01 ENCOUNTER — Other Ambulatory Visit: Payer: Self-pay | Admitting: Physician Assistant

## 2024-02-01 MED ORDER — SERTRALINE HCL 25 MG PO TABS
25.0000 mg | ORAL_TABLET | Freq: Every day | ORAL | 1 refills | Status: DC
  Filled 2024-02-01: qty 30, 30d supply, fill #0
  Filled 2024-04-23 (×2): qty 30, 30d supply, fill #1

## 2024-02-01 NOTE — Telephone Encounter (Signed)
 Last OV 11/16/23 Next OV 02/18/24 - canceled (provider)  Last refill 09/18/23 Qty #30/1

## 2024-02-08 ENCOUNTER — Other Ambulatory Visit: Payer: Self-pay

## 2024-02-08 MED ORDER — FLUTICASONE PROPIONATE 0.05 % EX CREA
1.0000 | TOPICAL_CREAM | Freq: Two times a day (BID) | CUTANEOUS | 3 refills | Status: AC | PRN
  Filled 2024-02-08: qty 60, 30d supply, fill #0

## 2024-02-08 MED ORDER — HALOBETASOL PROPIONATE 0.05 % EX CREA
1.0000 | TOPICAL_CREAM | Freq: Two times a day (BID) | CUTANEOUS | 3 refills | Status: AC | PRN
  Filled 2024-02-08: qty 60, 30d supply, fill #0

## 2024-02-11 ENCOUNTER — Other Ambulatory Visit: Payer: Self-pay

## 2024-02-11 ENCOUNTER — Ambulatory Visit: Payer: Self-pay

## 2024-02-11 NOTE — Telephone Encounter (Signed)
 FYI Only or Action Required?: FYI only for provider.  Patient was last seen in primary care on 11/16/2023 by Job Lukes, PA.  Called Nurse Triage reporting Palpitations.  Symptoms began several days ago.  Interventions attempted: Nothing.  Symptoms are: stable.  Triage Disposition: See Physician Within 24 Hours (overriding See HCP Within 4 Hours (Or PCP Triage))  Patient/caregiver understands and will follow disposition?: Yes                             Copied from CRM #[id number]. Topic: Clinical - Red Word Triage >> Feb 11, 2024  4:25 PM Corin V wrote: [facility] that prompted transfer to Nurse Triage: Patient scheduled via Mychart: Palpitations, episodes of light headed was, assessing for need to restart BP medication. Instructed to call for NT Reason for Disposition  [1] Skipped or extra beat(s) AND [2] increases with exercise or exertion  Answer Assessment - Initial Assessment Questions 1. DESCRIPTION: Please describe your heart rate or heartbeat that you are having (e.g., fast/slow, regular/irregular, skipped or extra beats, palpitations)     Increased frequency of PACs 2. ONSET: When did it start? (e.g., minutes, hours, days)      Becoming more frequent within past few days  3. DURATION: How long does it last (e.g., seconds, minutes, hours)     Lasts between a minute to a few minutes at a time 4. PATTERN Does it come and go, or has it been constant since it started?  Does it get worse with exertion?   Are you feeling it now?     States she has noticed an increase in frequency with exertion 6. HEART RATE: Can you tell me your heart rate? How many beats in 15 seconds?  Note: Not all patients can do this.       Unsure 7. RECURRENT SYMPTOM: Have you ever had this before? If Yes, ask: When was the last time? and What happened that time?      States this is nothing new for me, states she first noticed palpitations a year ago  9.  CARDIAC HISTORY: Do you have any history of heart disease? (e.g., heart attack, angina, bypass surgery, angioplasty, arrhythmia)      History of PACs 10. OTHER SYMPTOMS: Do you have any other symptoms? (e.g., dizziness, chest pain, sweating, difficulty breathing)       Lightheadedness within past 1-2 weeks (denies at this time), denies fainting/falls, denies headache, denies chest pain, denies weakness, denies numbness, denies changes in vision, denies changes in speech, denies difficulty breathing, has not recently measured BP    Advised patient to plan on attending her appointment tomorrow morning and call back if symptoms worsen. Patient verbalized understanding.  Protocols used: Heart Rate and Heartbeat Questions-A-AH

## 2024-02-12 ENCOUNTER — Other Ambulatory Visit: Payer: Self-pay

## 2024-02-12 ENCOUNTER — Ambulatory Visit (INDEPENDENT_AMBULATORY_CARE_PROVIDER_SITE_OTHER): Admitting: Physician Assistant

## 2024-02-12 ENCOUNTER — Ambulatory Visit: Payer: Self-pay | Admitting: Physician Assistant

## 2024-02-12 ENCOUNTER — Encounter: Payer: Self-pay | Admitting: Physician Assistant

## 2024-02-12 VITALS — BP 130/96 | HR 94 | Temp 97.9°F | Ht 63.0 in | Wt 257.2 lb

## 2024-02-12 DIAGNOSIS — I10 Essential (primary) hypertension: Secondary | ICD-10-CM

## 2024-02-12 DIAGNOSIS — R002 Palpitations: Secondary | ICD-10-CM

## 2024-02-12 DIAGNOSIS — R5383 Other fatigue: Secondary | ICD-10-CM

## 2024-02-12 DIAGNOSIS — R42 Dizziness and giddiness: Secondary | ICD-10-CM

## 2024-02-12 LAB — COMPREHENSIVE METABOLIC PANEL WITH GFR
ALT: 17 U/L (ref 0–35)
AST: 15 U/L (ref 0–37)
Albumin: 4.3 g/dL (ref 3.5–5.2)
Alkaline Phosphatase: 95 U/L (ref 39–117)
BUN: 14 mg/dL (ref 6–23)
CO2: 25 meq/L (ref 19–32)
Calcium: 9.1 mg/dL (ref 8.4–10.5)
Chloride: 105 meq/L (ref 96–112)
Creatinine, Ser: 0.63 mg/dL (ref 0.40–1.20)
GFR: 119.98 mL/min (ref >60.00)
Glucose, Bld: 87 mg/dL (ref 70–99)
Potassium: 3.7 meq/L (ref 3.5–5.1)
Sodium: 139 meq/L (ref 135–145)
Total Bilirubin: 0.4 mg/dL (ref 0.2–1.2)
Total Protein: 8.1 g/dL (ref 6.0–8.3)

## 2024-02-12 LAB — CBC WITH DIFFERENTIAL/PLATELET
Basophils Absolute: 0.1 K/uL (ref 0.0–0.1)
Basophils Relative: 0.7 % (ref 0.0–3.0)
Eosinophils Absolute: 0.1 K/uL (ref 0.0–0.7)
Eosinophils Relative: 1.8 % (ref 0.0–5.0)
HCT: 39.3 % (ref 36.0–46.0)
Hemoglobin: 13 g/dL (ref 12.0–15.0)
Lymphocytes Relative: 33.3 % (ref 12.0–46.0)
Lymphs Abs: 2.4 K/uL (ref 0.7–4.0)
MCHC: 33.1 g/dL (ref 30.0–36.0)
MCV: 87.4 fl (ref 78.0–100.0)
Monocytes Absolute: 0.4 K/uL (ref 0.1–1.0)
Monocytes Relative: 5.3 % (ref 3.0–12.0)
Neutro Abs: 4.3 K/uL (ref 1.4–7.7)
Neutrophils Relative %: 58.9 % (ref 43.0–77.0)
Platelets: 436 K/uL — ABNORMAL HIGH (ref 150.0–400.0)
RBC: 4.49 Mil/uL (ref 3.87–5.11)
RDW: 14 % (ref 11.5–15.5)
WBC: 7.3 K/uL (ref 4.0–10.5)

## 2024-02-12 LAB — IBC + FERRITIN
Ferritin: 56.9 ng/mL (ref 10.0–291.0)
Iron: 84 ug/dL (ref 42–145)
Saturation Ratios: 26 % (ref 20.0–50.0)
TIBC: 323.4 ug/dL (ref 250.0–450.0)
Transferrin: 231 mg/dL (ref 212.0–360.0)

## 2024-02-12 LAB — VITAMIN B12: Vitamin B-12: 479 pg/mL (ref 211–911)

## 2024-02-12 LAB — POCT URINE PREGNANCY: Preg Test, Ur: NEGATIVE

## 2024-02-12 LAB — TSH: TSH: 2.02 u[IU]/mL (ref 0.35–5.50)

## 2024-02-12 LAB — VITAMIN D 25 HYDROXY (VIT D DEFICIENCY, FRACTURES): VITD: 10.1 ng/mL — ABNORMAL LOW (ref 30.00–100.00)

## 2024-02-12 MED ORDER — VITAMIN D (ERGOCALCIFEROL) 1.25 MG (50000 UNIT) PO CAPS
50000.0000 [IU] | ORAL_CAPSULE | ORAL | 0 refills | Status: DC
  Filled 2024-02-12: qty 12, 84d supply, fill #0

## 2024-02-12 NOTE — Progress Notes (Signed)
 Jordan Jackson is a 28 y.o. female here for a follow up of a pre-existing problem.  History of Present Illness:   Chief Complaint  Patient presents with   Palpitations    Pt c/o palpitations started on Friday, have been off and on. Also some lightheadedness.    Discussed the use of AI scribe software for clinical note transcription with the patient, who gave verbal consent to proceed.  History of Present Illness Jordan Jackson is a 28 year old female with hypertension who presents with palpitations.  She experiences palpitations since Friday or Saturday, described as a sensation of being 'hit in the chest a little bit,' without associated chest pain or shortness of breath. She relates this to her history of premature atrial contractions (PACs). She has not been taking her blood pressure medication (nifedipine  30 mg 24 hr) and is uncertain about restarting it after a long hiatus, despite recently picking up a new prescription.  She received a steroid injection and oral steroids last week for eczema, starting the oral steroids on Friday and missing a dose yesterday. She reports lightheadedness for about two weeks, with one episode of severe dizziness described as 'spinning.' Lightheadedness occurs daily, often when changing positions from sitting to standing, and sometimes randomly at work. She admits to inadequate hydration and occasionally skipping meals due to her work schedule.  She experiences daily ankle swelling and occasional vaginal spotting, with the most recent episode a few days ago presenting as dark brown blood. She feels very tired despite adequate sleep and is trying to improve her diet. She is currently taking Zoloft  and reports her mental health as 'really good.' She consumes small amounts of caffeine, such as a soda once a day, and denies using energy drinks. Her husband has sleep apnea, and she suspects she might have it too, as she occasionally feels like she loses her breath  while sleeping, although this is rare.    Past Medical History:  Diagnosis Date   Fatty liver 10/24/2016   Refer to GI   Gastritis    easinophilic    GERD (gastroesophageal reflux disease)    Irregular periods/menstrual cycles 08/10/2015   Menstrual cramps 08/10/2015   Pelvic pain in female      Social History   Tobacco Use   Smoking status: Never   Smokeless tobacco: Never  Vaping Use   Vaping status: Never Used  Substance Use Topics   Alcohol use: Not Currently   Drug use: No    Past Surgical History:  Procedure Laterality Date   BIOPSY  12/26/2016   Procedure: BIOPSY;  Surgeon: Harvey Margo CROME, MD;  [facility]: [facility];  Service: Endoscopy;;  gastric duodenal   COLONOSCOPY N/A 12/26/2016   Procedure: COLONOSCOPY;  Surgeon: Harvey Margo CROME, MD;  [facility];  Service: Endoscopy;  Laterality: N/A;  1:00pm   ESOPHAGOGASTRODUODENOSCOPY N/A 12/26/2016   Procedure: ESOPHAGOGASTRODUODENOSCOPY (EGD);  Surgeon: Harvey Margo CROME, MD;  Location: [facility];  Service: Endoscopy;  Laterality: N/A;   NO PAST SURGERIES      Family History  Problem Relation Age of Onset   Hypertension Mother    Arthritis Mother    Hypertension Father    Diabetes Maternal Grandmother    Hypertension Paternal Grandmother    Hypertension Paternal Grandfather    Diabetes Paternal Grandfather    Inflammatory bowel disease Neg Hx    Liver disease Neg Hx    Celiac disease Neg Hx  Colon cancer Neg Hx     No Known Allergies  Current Medications:   Current Outpatient Medications:    famotidine  (PEPCID ) 10 MG tablet, Take 10 mg by mouth daily as needed for heartburn or indigestion., Disp: , Rfl:    fluticasone  (CUTIVATE ) 0.05 % cream, Apply 1 Application to affected areas topically 2 (two) times daily as needed. (face), Disp: 60 g, Rfl: 3   halobetasol  (ULTRAVATE ) 0.05 % cream, Apply 1 Application topically 2 (two) times daily as needed. (not to face, groin, axilla), Disp: 60  g, Rfl: 3   levonorgestrel  (MIRENA , 52 MG,) 20 MCG/DAY IUD, 1 each by Intrauterine route once. Inserted by GYN 11/2022, needs to be removed 11/2030, Disp: , Rfl:    NIFEdipine  (PROCARDIA  XL) 30 MG 24 hr tablet, Take 1 tablet (30 mg total) by mouth daily as directed (Patient not taking: Reported on 02/12/2024), Disp: 90 tablet, Rfl: 1   pantoprazole  (PROTONIX ) 40 MG tablet, Take 1 tablet (40 mg total) by mouth daily., Disp: 30 tablet, Rfl: 3   sertraline  (ZOLOFT ) 25 MG tablet, Take 1 tablet (25 mg total) by mouth daily., Disp: 30 tablet, Rfl: 1   Review of Systems:   Negative unless otherwise specified per HPI.  Vitals:   Vitals:   02/12/24 1018 02/12/24 1037  BP: (!) 142/100 (!) 130/96  Pulse: 94   Temp: 97.9 F (36.6 C)   TempSrc: Temporal   SpO2: 97%   Weight: 257 lb 4 oz (116.7 kg)   Height: 5' 3 (1.6 m)      Body mass index is 45.57 kg/m.  Physical Exam:   Physical Exam Vitals and nursing note reviewed.  Constitutional:      General: She is not in acute distress.    Appearance: She is well-developed. She is not ill-appearing or toxic-appearing.  Cardiovascular:     Rate and Rhythm: Normal rate and regular rhythm.     Pulses: Normal pulses.     Heart sounds: Normal heart sounds, S1 normal and S2 normal.  Pulmonary:     Effort: Pulmonary effort is normal.     Breath sounds: Normal breath sounds.  Skin:    General: Skin is warm and dry.  Neurological:     Mental Status: She is alert.     GCS: GCS eye subscore is 4. GCS verbal subscore is 5. GCS motor subscore is 6.  Psychiatric:        Speech: Speech normal.        Behavior: Behavior normal. Behavior is cooperative.    Results for orders placed or performed in visit on 02/12/24  POCT urine pregnancy  Result Value Ref Range   Preg Test, Ur Negative Negative    Assessment and Plan:   Assessment and Plan Assessment & Plan Palpitations and dizziness with fatigue Palpitations possibly related to steroid use.  Dizziness ongoing for two weeks, with daily lightheadedness. Fatigue persists. Differential includes steroid effects, dehydration, uncontrolled hypertension, and low pregnancy risk due to IUD. Previous cardiac workup unremarkable. - Start nifedipine  (Procardia  XL) 30 mg orally daily. - Increase water  intake to 64 ounces per day. - Order blood work and urinalysis. - pregnancy test is negative - Refer to cardiology if symptoms persist after steroid clearance.  Uncontrolled hypertension Hypertension uncontrolled due to non-adherence to nifedipine . Recent blood pressure monitoring absent. - Resume nifedipine  (Procardia  XL) 30 mg orally daily. - Monitor blood pressure regularly. -follow up in 1-2 months to reassess blood pressure -- sooner if concerns  She was advised if any new/worsening symptom(s) to reach out    Lucie Buttner, PA-C

## 2024-02-12 NOTE — Telephone Encounter (Signed)
 Appt today

## 2024-02-12 NOTE — Patient Instructions (Signed)
 It was great to see you!  Start taking your blood pressure medication  Drink 64 oz water  daily  If symptom(s) persist, let me know  If new symptom(s), let me know   Take care,  Lon Klippel PA-C

## 2024-02-18 ENCOUNTER — Encounter: Admitting: Physician Assistant

## 2024-02-21 ENCOUNTER — Encounter: Payer: Self-pay | Admitting: Physician Assistant

## 2024-02-21 ENCOUNTER — Other Ambulatory Visit: Payer: Self-pay

## 2024-02-21 ENCOUNTER — Other Ambulatory Visit: Payer: Self-pay | Admitting: Physician Assistant

## 2024-02-21 MED ORDER — AMLODIPINE BESYLATE 5 MG PO TABS
5.0000 mg | ORAL_TABLET | Freq: Every day | ORAL | 1 refills | Status: DC
  Filled 2024-02-21: qty 60, 60d supply, fill #0

## 2024-02-27 ENCOUNTER — Encounter: Payer: Self-pay | Admitting: Physician Assistant

## 2024-03-03 ENCOUNTER — Ambulatory Visit (INDEPENDENT_AMBULATORY_CARE_PROVIDER_SITE_OTHER): Admitting: Physician Assistant

## 2024-03-03 ENCOUNTER — Encounter: Payer: Self-pay | Admitting: Physician Assistant

## 2024-03-03 ENCOUNTER — Other Ambulatory Visit: Payer: Self-pay

## 2024-03-03 VITALS — BP 130/80 | HR 87 | Temp 97.9°F | Ht 63.0 in | Wt 240.0 lb

## 2024-03-03 DIAGNOSIS — R29818 Other symptoms and signs involving the nervous system: Secondary | ICD-10-CM

## 2024-03-03 DIAGNOSIS — R519 Headache, unspecified: Secondary | ICD-10-CM | POA: Diagnosis not present

## 2024-03-03 DIAGNOSIS — I10 Essential (primary) hypertension: Secondary | ICD-10-CM | POA: Diagnosis not present

## 2024-03-03 MED ORDER — DIAZEPAM 2 MG PO TABS
ORAL_TABLET | ORAL | 0 refills | Status: DC
  Filled 2024-03-03: qty 2, 1d supply, fill #0

## 2024-03-03 NOTE — Patient Instructions (Signed)
 It was great to see you!  Consider eye exam Obstructive sleep apnea evaluation  Take care,  Liani Caris PA-C

## 2024-03-03 NOTE — Progress Notes (Signed)
 Jordan Jackson is a 28 y.o. female here for a new problem.  History of Present Illness:   Chief Complaint  Patient presents with   Headache    Pt c/o headaches everyday for the past 3 weeks. Taking Ibuprofen  and Excedrin Migraine some relief, Tylenol  no relief. Headache is mainly top of head, sometimes lightheaded, nausea, blurred vision x 1.    Discussed the use of AI scribe software for clinical note transcription with the patient, who gave verbal consent to proceed.  History of Present Illness Jordan Jackson is a 28 year old female who presents with persistent headaches.  Headaches occur daily, primarily upon waking, and are more severe in the mornings. They often start on the left side and can persist throughout the day. Exertional headaches are described as 'pounding' and are sometimes associated with lightheadedness and nausea. Severe headaches have required multiple doses of ibuprofen  and Excedrin migraine for relief.  She restarted Procardia  in August and switched to amlodipine , with headaches persisting despite the change. She also takes sertraline  daily and has had an IUD since May of the previous year.  There are no recent changes in diet, hydration, sleep schedule, or caffeine intake. She maintains regular nutrition, including protein shakes or bars. COVID-19 test is negative.  Family history includes migraines on her mother's side and an aneurysm on her father's side. Symptoms suggestive of sleep apnea include snoring and daytime fatigue. Occasional orthostatic symptoms, such as heart racing when moving from sitting to standing, are noted.  Reports there is no: unilateral weakness, numbness, tingling, loss of vision    Past Medical History:  Diagnosis Date   Fatty liver 10/24/2016   Refer to GI   Gastritis    easinophilic    GERD (gastroesophageal reflux disease)    Irregular periods/menstrual cycles 08/10/2015   Menstrual cramps 08/10/2015   Pelvic pain in female       Social History   Tobacco Use   Smoking status: Never   Smokeless tobacco: Never  Vaping Use   Vaping status: Never Used  Substance Use Topics   Alcohol use: Not Currently   Drug use: No    Past Surgical History:  Procedure Laterality Date   BIOPSY  12/26/2016   Procedure: BIOPSY;  Surgeon: Harvey Margo CROME, MD;  [facility]: [facility];  Service: Endoscopy;;  gastric duodenal   COLONOSCOPY N/A 12/26/2016   Procedure: COLONOSCOPY;  Surgeon: Harvey Margo CROME, MD;  [facility]: [facility];  Service: Endoscopy;  Laterality: N/A;  1:00pm   ESOPHAGOGASTRODUODENOSCOPY N/A 12/26/2016   Procedure: ESOPHAGOGASTRODUODENOSCOPY (EGD);  Surgeon: Harvey Margo CROME, MD;  Location: [facility];  Service: Endoscopy;  Laterality: N/A;   NO PAST SURGERIES      Family History  Problem Relation Age of Onset   Hypertension Mother    Arthritis Mother    Hypertension Father    Diabetes Maternal Grandmother    Hypertension Paternal Grandmother    Hypertension Paternal Grandfather    Diabetes Paternal Grandfather    Inflammatory bowel disease Neg Hx    Liver disease Neg Hx    Celiac disease Neg Hx    Colon cancer Neg Hx     No Known Allergies  Current Medications:   Current Outpatient Medications:    amLODipine  (NORVASC ) 5 MG tablet, Take 1 tablet (5 mg total) by mouth daily., Disp: 30 tablet, Rfl: 1   famotidine  (PEPCID ) 10 MG tablet, Take 10 mg by mouth daily as needed for heartburn or  indigestion., Disp: , Rfl:    fluticasone  (CUTIVATE ) 0.05 % cream, Apply 1 Application to affected areas topically 2 (two) times daily as needed. (face), Disp: 60 g, Rfl: 3   halobetasol  (ULTRAVATE ) 0.05 % cream, Apply 1 Application topically 2 (two) times daily as needed. (not to face, groin, axilla), Disp: 60 g, Rfl: 3   levonorgestrel  (MIRENA , 52 MG,) 20 MCG/DAY IUD, 1 each by Intrauterine route once. Inserted by GYN 11/2022, needs to be removed 11/2030, Disp: , Rfl:    pantoprazole  (PROTONIX ) 40 MG  tablet, Take 1 tablet (40 mg total) by mouth daily., Disp: 30 tablet, Rfl: 3   sertraline  (ZOLOFT ) 25 MG tablet, Take 1 tablet (25 mg total) by mouth daily., Disp: 30 tablet, Rfl: 1   Vitamin D , Ergocalciferol , (DRISDOL ) 1.25 MG (50000 UNIT) CAPS capsule, Take 1 capsule (50,000 Units total) by mouth every 7 (seven) days., Disp: 12 capsule, Rfl: 0   Review of Systems:   Negative unless otherwise specified per HPI.  Vitals:   Vitals:   03/03/24 1147  BP: 130/80  Pulse: 87  Temp: 97.9 F (36.6 C)  TempSrc: Temporal  SpO2: 97%  Weight: 240 lb (108.9 kg)  Height: 5' 3 (1.6 m)     Body mass index is 42.51 kg/m.  Physical Exam:   Physical Exam Vitals and nursing note reviewed.  Constitutional:      General: She is not in acute distress.    Appearance: She is well-developed. She is not ill-appearing or toxic-appearing.  Cardiovascular:     Rate and Rhythm: Normal rate and regular rhythm.     Pulses: Normal pulses.     Heart sounds: Normal heart sounds, S1 normal and S2 normal.  Pulmonary:     Effort: Pulmonary effort is normal.     Breath sounds: Normal breath sounds.  Skin:    General: Skin is warm and dry.  Neurological:     General: No focal deficit present.     Mental Status: She is alert.     GCS: GCS eye subscore is 4. GCS verbal subscore is 5. GCS motor subscore is 6.     Cranial Nerves: Cranial nerves 2-12 are intact.     Sensory: Sensation is intact.     Motor: Motor function is intact.     Coordination: Coordination is intact.     Gait: Gait is intact.  Psychiatric:        Speech: Speech normal.        Behavior: Behavior normal. Behavior is cooperative.     Assessment and Plan:   Assessment and Plan Assessment & Plan Worsening headache(s)  Chronic daily headaches with a new severe episode, raising concern for possible mass effect. Differential includes medication side effects, sleep apnea, and migraines. Family history of migraines and aneurysm.  Neurological exam is normal. - Order MRI of the brain at [facility] with open MRI and valium  for procedural anxiety - Provide sample of Nurtec for acute migraine treatment. Instruct to take one tablet during a severe headache and a second tablet 48 hours later if needed. - Advise to go to the ER for severe headaches if necessary for immediate evaluation and possible MRI. - Monitor response to Nurtec and report back on its effectiveness.  Suspected obstructive sleep apnea Suspected due to symptoms of snoring, morning headaches, and daytime fatigue. Discussed contribution to headaches and fatigue. Sleep study evaluation planned to confirm diagnosis. - Order sleep study evaluation  Hypertension, controlled Hypertension is well-controlled on  amlodipine . - Continue current antihypertensive regimen with amlodipine  5 mg daily     Lucie Buttner, PA-C

## 2024-03-04 ENCOUNTER — Encounter: Payer: Self-pay | Admitting: Sleep Medicine

## 2024-03-04 ENCOUNTER — Ambulatory Visit (INDEPENDENT_AMBULATORY_CARE_PROVIDER_SITE_OTHER): Admitting: Sleep Medicine

## 2024-03-04 VITALS — BP 110/70 | HR 97 | Temp 98.1°F | Ht 63.0 in | Wt 259.2 lb

## 2024-03-04 DIAGNOSIS — I10 Essential (primary) hypertension: Secondary | ICD-10-CM

## 2024-03-04 DIAGNOSIS — G4733 Obstructive sleep apnea (adult) (pediatric): Secondary | ICD-10-CM | POA: Diagnosis not present

## 2024-03-04 DIAGNOSIS — Z6841 Body Mass Index (BMI) 40.0 and over, adult: Secondary | ICD-10-CM

## 2024-03-04 NOTE — Progress Notes (Signed)
 Name:Jordan Jackson MRN: [id number] DOB: [birth year]   CHIEF COMPLAINT:  EXCESSIVE DAYTIME SLEEPINESS   HISTORY OF PRESENT ILLNESS: [name] is a 28 y.o. w/ a h/o HTN, anxiety, depression and morbid obesity who presents for c/o loud snoring and excessive daytime sleepiness which has been present for several years. Reports nocturnal awakenings due to unclear reasons, however does not have difficulty falling back to sleep. Reports a 60 lb weight gain over the last few years. Admits to morning headaches. Denies RLS symptoms, dream enactment, cataplexy, hypnagogic or hypnapompic hallucinations. Reports a family history of sleep apnea. Admits to drowsy driving. Drinks soda occasionally, denies alcohol, tobacco or illicit drug use.   Bedtime 10-10:30 pm Sleep onset 30 mins Rise time 5:30-7:30 am   EPWORTH SLEEP SCORE 6    03/04/2024   11:00 AM  Results of the Epworth flowsheet  Sitting and reading 0  Watching TV 2  Sitting, inactive in a public place (e.g. a theatre or a meeting) 0  As a passenger in a car for an hour without a break 0  Lying down to rest in the afternoon when circumstances permit 3  Sitting and talking to someone 0  Sitting quietly after a lunch without alcohol 1  In a car, while stopped for a few minutes in traffic 0  Total score 6    PAST MEDICAL HISTORY :   has a past medical history of Fatty liver (10/24/2016), Gastritis, GERD (gastroesophageal reflux disease), Irregular periods/menstrual cycles (08/10/2015), Menstrual cramps (08/10/2015), and Pelvic pain in female.  has a past surgical history that includes No past surgeries; Colonoscopy (N/A, 12/26/2016); Esophagogastroduodenoscopy (N/A, 12/26/2016); and biopsy (12/26/2016). Prior to Admission medications   Medication Sig Start Date End Date Taking? Authorizing Provider  amLODipine  (NORVASC ) 5 MG tablet Take 1 tablet (5 mg total) by mouth daily. 02/21/24   Job Lukes, PA  diazepam  (VALIUM ) 2 MG  tablet Take 1 tablet 30 minutes prior to procedure. May take an additional tablet at start of procedure if needed. Do not drive after taking medication. 03/03/24   Job Lukes, PA  famotidine  (PEPCID ) 10 MG tablet Take 10 mg by mouth daily as needed for heartburn or indigestion.    [provider]  fluticasone  (CUTIVATE ) 0.05 % cream Apply 1 Application to affected areas topically 2 (two) times daily as needed. (face) 02/08/24     halobetasol  (ULTRAVATE ) 0.05 % cream Apply 1 Application topically 2 (two) times daily as needed. (not to face, groin, axilla) 02/08/24     levonorgestrel  (MIRENA , 52 MG,) 20 MCG/DAY IUD 1 each by Intrauterine route once. Inserted by GYN 11/2022, needs to be removed 11/2030 11/08/22   [provider]  pantoprazole  (PROTONIX ) 40 MG tablet Take 1 tablet (40 mg total) by mouth daily. 11/16/23   Job Lukes, PA  sertraline  (ZOLOFT ) 25 MG tablet Take 1 tablet (25 mg total) by mouth daily. 02/01/24   Job Lukes, PA  Vitamin D , Ergocalciferol , (DRISDOL ) 1.25 MG (50000 UNIT) CAPS capsule Take 1 capsule (50,000 Units total) by mouth every 7 (seven) days. 02/12/24   Job Lukes, PA   No Known Allergies  FAMILY HISTORY:  family history includes Arthritis in her mother; Diabetes in her maternal grandmother and paternal grandfather; Hypertension in her father, mother, paternal grandfather, and paternal grandmother. SOCIAL HISTORY:  reports that she has never smoked. She has never used smokeless tobacco. She reports that she does not currently use alcohol. She reports that she does  not use drugs.   Review of Systems:  Gen:  Denies  fever, sweats, chills weight loss  HEENT: Denies blurred vision, double vision, ear pain, eye pain, hearing loss, nose bleeds, sore throat Cardiac:  No dizziness, chest pain or heaviness, chest tightness,edema, No JVD Resp:   No cough, -sputum production, -shortness of breath,-wheezing, -hemoptysis,  Gi: Denies swallowing  difficulty, stomach pain, nausea or vomiting, diarrhea, constipation, bowel incontinence Gu:  Denies bladder incontinence, burning urine Ext:   Denies Joint pain, stiffness or swelling Skin: Denies  skin rash, easy bruising or bleeding or hives Endoc:  Denies polyuria, polydipsia , polyphagia or weight change Psych:   Denies depression, insomnia or hallucinations  Other:  All other systems negative  VITAL SIGNS: BP 110/70   Pulse 97   Temp 98.1 F (36.7 C)   Ht 5' 3 (1.6 m)   Wt 259 lb 3.2 oz (117.6 kg)   LMP  (LMP Unknown)   SpO2 97%   Breastfeeding No   BMI 45.92 kg/m    Physical Examination:   General Appearance: No distress  EYES PERRLA, EOM intact.   NECK Supple, No JVD Pulmonary: normal breath sounds, No wheezing.  CardiovascularNormal S1,S2.  No m/r/g.   Abdomen: Benign, Soft, non-tender. Skin:   warm, no rashes, no ecchymosis  Extremities: normal, no cyanosis, clubbing. Neuro:without focal findings,  speech normal  PSYCHIATRIC: Mood, affect within normal limits.   ASSESSMENT AND PLAN  OSA I suspect that OSA is likely present due to clinical presentation. Discussed the consequences of untreated sleep apnea. Advised not to drive drowsy for safety of patient and others. Will complete further evaluation with a home sleep study and follow up to review results.    HTN Stable, on current management. Following with PCP.   Morbid obesity Counseled patient on diet and lifestyle modification.    MEDICATION ADJUSTMENTS/LABS AND TESTS ORDERED: Recommend Sleep Study   Patient  satisfied with Plan of action and management. All questions answered  Follow up to review HST results and treatment plan.   I spent a total of 51 minutes reviewing chart data, face-to-face evaluation with the patient, counseling and coordination of care as detailed above.    Conda Wannamaker, M.D.  Sleep Medicine [facility]

## 2024-03-04 NOTE — Patient Instructions (Signed)
 Jordan Jackson

## 2024-03-07 ENCOUNTER — Ambulatory Visit
Admission: RE | Admit: 2024-03-07 | Discharge: 2024-03-07 | Disposition: A | Discharge status: Home / Self Care | Source: Ambulatory Visit | Attending: Physician Assistant | Admitting: Physician Assistant

## 2024-03-07 ENCOUNTER — Other Ambulatory Visit

## 2024-03-07 DIAGNOSIS — R519 Headache, unspecified: Secondary | ICD-10-CM | POA: Diagnosis not present

## 2024-03-09 ENCOUNTER — Encounter: Payer: Self-pay | Admitting: Emergency Medicine

## 2024-03-09 ENCOUNTER — Other Ambulatory Visit: Payer: Self-pay

## 2024-03-09 DIAGNOSIS — H538 Other visual disturbances: Secondary | ICD-10-CM | POA: Diagnosis not present

## 2024-03-09 DIAGNOSIS — I10 Essential (primary) hypertension: Secondary | ICD-10-CM | POA: Diagnosis not present

## 2024-03-09 DIAGNOSIS — Z6830 Body mass index (BMI) 30.0-30.9, adult: Secondary | ICD-10-CM | POA: Insufficient documentation

## 2024-03-09 DIAGNOSIS — Z7901 Long term (current) use of anticoagulants: Secondary | ICD-10-CM | POA: Insufficient documentation

## 2024-03-09 DIAGNOSIS — Z79899 Other long term (current) drug therapy: Secondary | ICD-10-CM | POA: Diagnosis not present

## 2024-03-09 DIAGNOSIS — E669 Obesity, unspecified: Secondary | ICD-10-CM | POA: Diagnosis not present

## 2024-03-09 DIAGNOSIS — R519 Headache, unspecified: Principal | ICD-10-CM | POA: Insufficient documentation

## 2024-03-09 LAB — BASIC METABOLIC PANEL WITH GFR
Anion gap: 9 (ref 5–15)
BUN: 13 mg/dL (ref 6–20)
CO2: 24 mmol/L (ref 22–32)
Calcium: 9 mg/dL (ref 8.9–10.3)
Chloride: 104 mmol/L (ref 98–111)
Creatinine, Ser: 0.71 mg/dL (ref 0.44–1.00)
GFR, Estimated: >60 mL/min (ref >60)
Glucose, Bld: 117 mg/dL — ABNORMAL HIGH (ref 70–99)
Potassium: 3.5 mmol/L (ref 3.5–5.1)
Sodium: 137 mmol/L (ref 135–145)

## 2024-03-09 LAB — CBC
HCT: 39.9 % (ref 36.0–46.0)
Hemoglobin: 13.3 g/dL (ref 12.0–15.0)
MCH: 29.1 pg (ref 26.0–34.0)
MCHC: 33.3 g/dL (ref 30.0–36.0)
MCV: 87.3 fL (ref 80.0–100.0)
Platelets: 410 K/uL — ABNORMAL HIGH (ref 150–400)
RBC: 4.57 MIL/uL (ref 3.87–5.11)
RDW: 13.5 % (ref 11.5–15.5)
WBC: 9.2 K/uL (ref 4.0–10.5)
nRBC: 0 % (ref 0.0–0.2)

## 2024-03-09 NOTE — ED Triage Notes (Signed)
 Patient c/o headaches x 3 weeks.  Patient has seen PCP and has had MRI, but has not had results given to her yet.

## 2024-03-10 ENCOUNTER — Emergency Department

## 2024-03-10 ENCOUNTER — Ambulatory Visit: Payer: Self-pay | Admitting: Physician Assistant

## 2024-03-10 ENCOUNTER — Observation Stay
Admission: EM | Admit: 2024-03-10 | Discharge: 2024-03-11 | Disposition: A | Discharge status: Home / Self Care | Attending: Obstetrics and Gynecology | Admitting: Obstetrics and Gynecology

## 2024-03-10 DIAGNOSIS — I10 Essential (primary) hypertension: Secondary | ICD-10-CM | POA: Diagnosis present

## 2024-03-10 DIAGNOSIS — G44021 Chronic cluster headache, intractable: Secondary | ICD-10-CM

## 2024-03-10 DIAGNOSIS — E669 Obesity, unspecified: Secondary | ICD-10-CM | POA: Diagnosis present

## 2024-03-10 DIAGNOSIS — R519 Headache, unspecified: Principal | ICD-10-CM | POA: Diagnosis present

## 2024-03-10 LAB — HCG, QUANTITATIVE, PREGNANCY: hCG, Beta Chain, Quant, S: <1 m[IU]/mL (ref <5)

## 2024-03-10 MED ORDER — ACETAMINOPHEN 325 MG PO TABS
650.0000 mg | ORAL_TABLET | Freq: Four times a day (QID) | ORAL | Status: DC | PRN
  Administered 2024-03-10 – 2024-03-11 (×3): 650 mg via ORAL
  Filled 2024-03-10 (×3): qty 2

## 2024-03-10 MED ORDER — ENOXAPARIN SODIUM 40 MG/0.4ML IJ SOSY: 40.0000 mg | PREFILLED_SYRINGE | INTRAMUSCULAR | Status: DC

## 2024-03-10 MED ORDER — ONDANSETRON HCL 4 MG PO TABS: 4.0000 mg | ORAL_TABLET | Freq: Four times a day (QID) | ORAL | Status: DC | PRN

## 2024-03-10 MED ORDER — GADOBUTROL 1 MMOL/ML IV SOLN
10.0000 mL | Freq: Once | INTRAVENOUS | Status: AC | PRN
  Administered 2024-03-10: 10 mL via INTRAVENOUS

## 2024-03-10 MED ORDER — ONDANSETRON HCL 4 MG/2ML IJ SOLN: 4.0000 mg | Freq: Four times a day (QID) | INTRAMUSCULAR | Status: DC | PRN

## 2024-03-10 MED ORDER — KETOROLAC TROMETHAMINE 15 MG/ML IJ SOLN
15.0000 mg | Freq: Once | INTRAMUSCULAR | Status: AC
  Administered 2024-03-10: 15 mg via INTRAVENOUS
  Filled 2024-03-10: qty 1

## 2024-03-10 MED ORDER — PROCHLORPERAZINE EDISYLATE 10 MG/2ML IJ SOLN
10.0000 mg | Freq: Once | INTRAMUSCULAR | Status: AC
  Administered 2024-03-10: 10 mg via INTRAVENOUS
  Filled 2024-03-10: qty 2

## 2024-03-10 MED ORDER — ACETAMINOPHEN 650 MG RE SUPP: 650.0000 mg | Freq: Four times a day (QID) | RECTAL | Status: DC | PRN

## 2024-03-10 MED ORDER — LORAZEPAM 2 MG/ML IJ SOLN
1.0000 mg | Freq: Once | INTRAMUSCULAR | Status: AC
  Administered 2024-03-10: 1 mg via INTRAVENOUS
  Filled 2024-03-10: qty 1

## 2024-03-10 MED ORDER — POLYETHYLENE GLYCOL 3350 17 G PO PACK: 17.0000 g | PACK | Freq: Every day | ORAL | Status: DC | PRN

## 2024-03-10 MED ORDER — ENOXAPARIN SODIUM 60 MG/0.6ML IJ SOSY
0.5000 mg/kg | PREFILLED_SYRINGE | INTRAMUSCULAR | Status: DC
  Filled 2024-03-10: qty 0.6

## 2024-03-10 MED ORDER — ACETAMINOPHEN 500 MG PO TABS
1000.0000 mg | ORAL_TABLET | Freq: Once | ORAL | Status: AC
  Administered 2024-03-10: 1000 mg via ORAL
  Filled 2024-03-10: qty 2

## 2024-03-10 MED ORDER — BUTALBITAL-APAP-CAFFEINE 50-325-40 MG PO TABS: 1.0000 | ORAL_TABLET | ORAL | Status: DC | PRN

## 2024-03-10 NOTE — ED Notes (Signed)
 In room with pt and teleneuro consult now

## 2024-03-10 NOTE — Plan of Care (Signed)

## 2024-03-10 NOTE — H&P (Signed)
 History and Physical    Jordan Jackson FMW:[id number] DOB: 1995/08/03 DOA: 03/10/2024  DOS: the patient was seen and examined on 03/10/2024  PCP: Job Lukes, PA   Patient coming from: Home  I have personally briefly reviewed patient's old medical records in [facility]  Chief Complaint: Headache and blurry vision  HPI: Jordan Jackson is a pleasant 28 y.o. female with medical history significant for HTN, obesity who presented to ED at [facility] for severe headache and blurry vision.  Patient had similar symptoms and had an MRI done on 03/07/2024 for similar symptoms at [facility].  MRI of the brain did not show any acute pathology except there was finding of likely partial empty sella which could be related to underlying idiopathic intracranial hypertension.  Her headache symptoms started 3 weeks ago, has been waking up with headaches and it has been worsening severity and frequency now associated with blurry vision. Patient denies any fever, chills, nausea, vomiting, trauma to head.  She denies similar symptoms in the past.  She denies any migraine headaches.  ED Course: Upon arrival to the ED, patient is found to have normal exams.  Due to recurrent headaches teleneurology was consulted who advised to do the neurochecks, MRI of the brain with MRV to rule out venous sinus thrombosis.  And also advised for LP and neurology evaluation.  Review of Systems:  ROS  All other systems negative except as noted in the HPI.  Past Medical History:  Diagnosis Date   Fatty liver 10/24/2016   Refer to GI   Gastritis    easinophilic    GERD (gastroesophageal reflux disease)    Irregular periods/menstrual cycles 08/10/2015   Menstrual cramps 08/10/2015   Pelvic pain in female     Past Surgical History:  Procedure Laterality Date   BIOPSY  12/26/2016   Procedure: BIOPSY;  Surgeon: Harvey Margo CROME, MD;  [facility]: [facility];  Service: Endoscopy;;  gastric duodenal   COLONOSCOPY  N/A 12/26/2016   Procedure: COLONOSCOPY;  Surgeon: Harvey Margo CROME, MD;  Location: [facility];  Service: Endoscopy;  Laterality: N/A;  1:00pm   ESOPHAGOGASTRODUODENOSCOPY N/A 12/26/2016   Procedure: ESOPHAGOGASTRODUODENOSCOPY (EGD);  Surgeon: Harvey Margo CROME, MD;  Location: [facility];  Service: Endoscopy;  Laterality: N/A;   NO PAST SURGERIES       reports that she has never smoked. She has never used smokeless tobacco. She reports that she does not currently use alcohol. She reports that she does not use drugs.  No Known Allergies  Family History  Problem Relation Age of Onset   Hypertension Mother    Arthritis Mother    Hypertension Father    Diabetes Maternal Grandmother    Hypertension Paternal Grandmother    Hypertension Paternal Grandfather    Diabetes Paternal Grandfather    Inflammatory bowel disease Neg Hx    Liver disease Neg Hx    Celiac disease Neg Hx    Colon cancer Neg Hx     Prior to Admission medications   Medication Sig Start Date End Date Taking? Authorizing Provider  amLODipine  (NORVASC ) 5 MG tablet Take 1 tablet (5 mg total) by mouth daily. 02/21/24  Yes Job Lukes, PA  famotidine  (PEPCID ) 10 MG tablet Take 10 mg by mouth daily as needed for heartburn or indigestion.   Yes [provider]  fluticasone  (CUTIVATE ) 0.05 % cream Apply 1 Application to affected areas topically 2 (two) times daily as needed. (face) 02/08/24  Yes   halobetasol  (ULTRAVATE ) 0.05 % cream Apply 1 Application topically 2 (two) times daily as needed. (not to face, groin, axilla) 02/08/24  Yes   pantoprazole  (PROTONIX ) 40 MG tablet Take 1 tablet (40 mg total) by mouth daily. 11/16/23  Yes Job Lukes, PA  sertraline  (ZOLOFT ) 25 MG tablet Take 1 tablet (25 mg total) by mouth daily. 02/01/24  Yes Job Lukes, PA  Vitamin D , Ergocalciferol , (DRISDOL ) 1.25 MG (50000 UNIT) CAPS capsule Take 1 capsule (50,000 Units total) by mouth every 7 (seven) days. 02/12/24  Yes Job Lukes, PA  diazepam  (VALIUM ) 2 MG tablet Take 1 tablet 30 minutes prior to procedure. May take an additional tablet at start of procedure if needed. Do not drive after taking medication. Patient not taking: Reported on 03/10/2024 03/03/24   Job Lukes, PA  levonorgestrel  (MIRENA , 52 MG,) 20 MCG/DAY IUD 1 each by Intrauterine route once. Inserted by GYN 11/2022, needs to be removed 11/2030 11/08/22   [provider]    Physical Exam: Vitals:   03/09/24 2213 03/10/24 0416 03/10/24 0600 03/10/24 0820  BP:  114/71 108/72 120/62  Pulse:  89 87 80  Resp:  16  14  Temp:  97.8 F (36.6 C)  97.9 F (36.6 C)  TempSrc:  Oral  Oral  SpO2:  100% 100% 95%  Weight: 117.6 kg     Height: 5' 3 (1.6 m)       Physical Exam   Constitutional: Alert, awake, calm, comfortable HEENT: Neck supple Respiratory: Clear to auscultation B/L, no wheezing, no rales.  Cardiovascular: Regular rate and rhythm, no murmurs / rubs / gallops. No extremity edema. 2+ pedal pulses. No carotid bruits.  Abdomen: Soft, no tenderness, Bowel sounds positive.  Musculoskeletal: no clubbing / cyanosis. Good ROM, no contractures. Normal muscle tone.  Skin: no rashes, lesions, ulcers. Neurologic: CN 2-12 grossly intact. Sensation intact, No focal deficit identified Psychiatric: Alert and oriented x 3. Normal mood.    Labs on Admission: I have personally reviewed following labs and imaging studies  CBC: Recent Labs  Lab 03/09/24 2215  WBC 9.2  HGB 13.3  HCT 39.9  MCV 87.3  PLT 410*   Basic Metabolic Panel: Recent Labs  Lab 03/09/24 2215  NA 137  K 3.5  CL 104  CO2 24  GLUCOSE 117*  BUN 13  CREATININE 0.71  CALCIUM 9.0   GFR: Estimated Creatinine Clearance: 128.6 mL/min (by C-G formula based on SCr of 0.71 mg/dL). Liver Function Tests: No results for input(s): AST, ALT, ALKPHOS, BILITOT, PROT, ALBUMIN in the last 168 hours. No results for input(s): LIPASE, AMYLASE in the last 168  hours. No results for input(s): AMMONIA in the last 168 hours. Coagulation Profile: No results for input(s): INR, PROTIME in the last 168 hours. Cardiac Enzymes: No results for input(s): CKTOTAL, CKMB, CKMBINDEX, TROPONINI, TROPONINIHS in the last 168 hours. BNP (last 3 results) No results for input(s): BNP in the last 8760 hours. HbA1C: No results for input(s): HGBA1C in the last 72 hours. CBG: No results for input(s): GLUCAP in the last 168 hours. Lipid Profile: No results for input(s): CHOL, HDL, LDLCALC, TRIG, CHOLHDL, LDLDIRECT in the last 72 hours. Thyroid  Function Tests: No results for input(s): TSH, T4TOTAL, FREET4, T3FREE, THYROIDAB in the last 72 hours. Anemia Panel: No results for input(s): VITAMINB12, FOLATE, FERRITIN, TIBC, IRON, RETICCTPCT in the last 72 hours. Urine analysis:    Component Value Date/Time   COLORURINE YELLOW 04/28/2022 0819   APPEARANCEUR CLEAR  04/28/2022 0819   LABSPEC 1.027 04/28/2022 0819   PHURINE 5.0 04/28/2022 0819   GLUCOSEU NEGATIVE 04/28/2022 0819   HGBUR SMALL (A) 04/28/2022 0819   BILIRUBINUR NEGATIVE 04/28/2022 0819   KETONESUR NEGATIVE 04/28/2022 0819   PROTEINUR NEGATIVE 04/28/2022 0819   UROBILINOGEN 0.2 05/29/2017 1411   NITRITE NEGATIVE 04/28/2022 0819   LEUKOCYTESUR NEGATIVE 04/28/2022 0819    Radiological Exams on Admission: I have personally reviewed images MR MRV HEAD W WO CONTRAST Result Date: 03/10/2024 EXAM: MRV BRAIN 03/10/2024 07:52:59 AM TECHNIQUE: Multiplanar multisequence MRV of the head was performed with the administration of intravenous contrast. Multiplanar 2D and 3D reformatted images are provided for review. COMPARISON: MRI of the head dated 03/07/2024. CLINICAL HISTORY: Severe headaches for 3 weeks, concern for idiopathic intracranial hypertension. FINDINGS: No focal stenosis or thrombus is seen of the major dural venous sinuses. IMPRESSION: 1. No evidence of  venous sinus thrombosis. Electronically signed by: Evalene Coho MD 03/10/2024 08:18 AM EDT RP Workstation: [id number]   MR Brain W and Wo Contrast Result Date: 03/10/2024 EXAM: MRI BRAIN WITH AND WITHOUT CONTRAST 03/10/2024 07:52:59 AM TECHNIQUE: Multiplanar multisequence MRI of the head/brain was performed with and without the administration of intravenous contrast. COMPARISON: MRI of the brain dated 03/07/2024. CLINICAL HISTORY: Severe headaches for 3 weeks, concern for idiopathic intracranial hypertension. 28 year old female presents with severe headache and blurry vision. Headaches started abruptly 3 weeks ago, have been waking up with headaches, and have progressed in severity/frequency. Also with now blurred vision that comes on in the morning. FINDINGS: BRAIN AND VENTRICLES: No acute infarct. No acute intracranial hemorrhage. No mass effect or midline shift. No hydrocephalus. Expanded, partially empty sella turcica is demonstrated. The pituitary gland enhances homogeneously and demonstrates no evidence of mass. The pituitary stalk is unremarkable. The brain parenchyma appears normal. Normal flow voids. ORBITS: The optic nerves are nondilated. SINUSES: Mucous retention cyst is demonstrated posteriorly within the left sphenoid sinus. BONES AND SOFT TISSUES: Normal bone marrow signal and enhancement. No acute soft tissue abnormality. IMPRESSION: 1. No acute intracranial abnormality. 2. Expanded, partially empty sella turcica, stable compared to prior study, with homogeneously enhancing pituitary gland and unremarkable pituitary stalk. Electronically signed by: Evalene Coho MD 03/10/2024 08:14 AM EDT RP Workstation: [id number]    EKG: NA    Assessment/Plan Principal Problem:   Headache Active Problems:   Obesity (BMI 30-39.9)   HTN (hypertension)    Assessment and Plan: 28 year old obese female with history of hypertension who was admitted for headache and blurry vision.  1.  Possible  idiopathic intracranial hypertension - Patient's symptoms have improved - Will continue monitoring her symptoms - Will call neurology - MRI of the brain and MRV - Following at the recommendation from teleneurology.  Will follow the recommendation from neurology once seen by neurologist.   Tele-Neurology Recommendations: -admit for further workup and q4h neurochecks/vitals -obtain MRV head (or CTV if MRV cannot be done) -obtain MRI brain w/ and w/o contrast -obtain LP with usual studies (glucose, protein, cell count, bacterial culture) but would also make sure to obtain opening pressure -likely ok to wait until outpatient setting for ophthalmology evaluation but if blurry vision progresses while here, may need more urgent evaluation -if all above is negative, could consider starting low dose diamox  at that point -call neurology immediately with significant change in exam  2.  HTN - Blood pressure remains to be stable - Continue home amlodipine        DVT prophylaxis: Lovenox  Code Status: Full Code  Family Communication: None   Disposition Plan: Home  Consults called: Neurology  Admission status: Observation, Med-Surg   Nena Rebel, MD [facility] 03/10/2024, 8:58 AM

## 2024-03-10 NOTE — Consult Note (Signed)
 [facility] Tele[facility]urology Consult Services  Stat Consult  Patient Name:   Jordan Jackson, Jordan Jackson Date of Birth:   1996-01-30 Identification Number:   MRN - [id number] Date of Service:   03/10/2024 04:57:14  Diagnosis:       H53.8 - Blurred Vision       R51.9 - Headache, unspecified  Impression 28 yo F who presents with severe headache and blurry vision. MRI brain done on 03/07/24 does not show acute abnormalities but does show likely partial empty sella which potentially could be related to underlying idiopathic intracranial hypertension. Headaches started abruptly 3 weeks ago, has been waking up with headaches and have progressed in severity/frequency. Also with now blurred vision that comes on in am. With abrupt onset of headaches, would want further testing done such as CTV or MRV of brain to rule out venous sinus thrombosis as possible etiology. Also with such rapid progression of headaches, would obtain LP to ensure no underlying infections present and at same time, would obtain opening pressure to evaluate for elevated opening pressure that would be seen with IIH. Full recommendations as follows:  Recommendations: -admit for further workup and q4h neurochecks/vitals -obtain MRV head (or CTV if MRV cannot be done) -obtain MRI brain w/ and w/o contrast -obtain LP with usual studies (glucose, protein, cell count, bacterial culture) but would also make sure to obtain opening pressure -likely ok to wait until outpatient setting for ophthalmology evaluation but if blurry vision progresses while here, may need more urgent evaluation -if all above is negative, could consider starting low dose diamox  at that point -call neurology immediately with significant change in exam    Dispositions : Neurology will follow    ----------------------------------------------------------------------------------------------------    Metrics: Dispatch Time: 03/10/2024 04:52:55 Callback  Response Time: 03/10/2024 04:54:36  Primary Provider Notified of Diagnostic Impression and Management Plan on: 03/10/2024 06:23:57    Imaging MRI brain done on 03/07/24 does not show acute abnormalities but does show likely partial empty sella which potentially could be related to underlying idiopathic intracranial hypertension   ----------------------------------------------------------------------------------------------------  Chief Complaint: severe headaches  History of Present Illness: Patient is a 28 year old Female. 28 yo F who presents with severe headache and blurry vision.  Per report, patient Presents to ED with 3 weeks of severe headache. MR outpatient completed this week. suspected idiopathic intracranial hypertension.  Patient states she has had headaches for last 3 weeks. Denies having headaches prior to that; states she never really has headaches at all. Headaches in last 3 weeks described as dull ache and at times feels like a rubber band is across top of her head. Sometimes when she bends down she will have alot of pressure in her head but it will go away. Also will get pounding headache (at top part of head) with exertion. No light/sound sensitivity. Has been waking up with a headache. Tends to have blurry vision morning but will resolve during day. Blurry vision might be a little worse in morning (might be more noticeable). Headaches have become more frequent and the pressure that she will have has become more severe in last couple days.  No associated weakness, numbness or speech changes.    Past Medical History:      Hypertension      There is no history of Diabetes Mellitus      There is no history of Atrial Fibrillation      There is no history of Stroke      There is no history  of Migraine Headaches Other PMH:  PCOS, GERD  Medications:  No Anticoagulant use  No Antiplatelet use Reviewed EMR for current medications  Allergies:  Reviewed  Social  History: Smoking: No Alcohol Use: No Drug Use: No  Family History:  There is no family history of premature cerebrovascular disease pertinent to this consultation  ROS : 14 Points Review of Systems was performed and was negative except mentioned in HPI.      Examination: BP(108/72), Pulse(84), Blood Glucose(117) 1A: Level of Consciousness - Alert; keenly responsive + 0 1B: Ask Month and Age - Both Questions Right + 0 1C: Blink Eyes & Squeeze Hands - Performs Both Tasks + 0 2: Test Horizontal Extraocular Movements - Normal + 0 3: Test Visual Fields - No Visual Loss + 0 4: Test Facial Palsy (Use Grimace if Obtunded) - Normal symmetry + 0 5A: Test Left Arm Motor Drift - No Drift for 10 Seconds + 0 5B: Test Right Arm Motor Drift - No Drift for 10 Seconds + 0 6A: Test Left Leg Motor Drift - No Drift for 5 Seconds + 0 6B: Test Right Leg Motor Drift - No Drift for 5 Seconds + 0 7: Test Limb Ataxia (FNF/Heel-Shin) - No Ataxia + 0 8: Test Sensation - Normal; No sensory loss + 0 9: Test Language/Aphasia - Normal; No aphasia + 0 10: Test Dysarthria - Normal + 0 11: Test Extinction/Inattention - No abnormality + 0  NIHSS Score: 0  Spoke with : Dr. Clarine    This consult was conducted in real time using interactive audio and Immunologist. Patient was informed of the technology being used for this visit and agreed to proceed. Patient located in hospital and provider located at home/office setting.  Patient is being evaluated for possible acute neurologic impairment and high probability of imminent or life - threatening deterioration.I spent total of 35 minutes providing care to this patient, including time for face to face visit via telemedicine, review of medical records, imaging studies and discussion of findings with providers, the patient and / or family.   Dr Garnette [facility]     [facility] For Inpatient follow-up with [facility] physician please call [facility] at  [phone]. As we are not an outpatient service for any post hospital discharge needs please contact the hospital for assistance.  If you have any questions for the [facility] physicians or need to reconsult for clinical or diagnostic changes please contact us  via [facility] at [phone].   Signature : [facility]

## 2024-03-10 NOTE — Progress Notes (Signed)
 PHARMACIST - PHYSICIAN COMMUNICATION  CONCERNING:  Enoxaparin  (Lovenox ) for DVT Prophylaxis    RECOMMENDATION: Patient was prescribed enoxaprin 40mg  q24 hours for VTE prophylaxis.   Filed Weights   03/09/24 2213  Weight: 117.6 kg (259 lb 4.2 oz)    Body mass index is 45.93 kg/m.  Estimated Creatinine Clearance: 128.6 mL/min (by C-G formula based on SCr of 0.71 mg/dL).   Based on [facility] policy patient is candidate for enoxaparin  0.5mg /kg TBW SQ every 24 hours based on BMI being >30.  DESCRIPTION: Pharmacy has adjusted enoxaparin  dose per [facility] policy.  Patient is now receiving enoxaparin  60 mg every 24 hours    Lum VEAR Mania, PharmD Clinical Pharmacist  03/10/2024 8:56 AM

## 2024-03-10 NOTE — ED Notes (Signed)
 Pt to MRI at this time.

## 2024-03-10 NOTE — ED Provider Notes (Signed)
 [facility] Provider Note    Event Date/Time   First MD Initiated Contact with Patient 03/10/24 [phone]     (approximate)   History   Headache  Patient c/o headaches x 3 weeks.  Patient has seen PCP and has had MRI, but has not had results given to her yet.     HPI Jordan Jackson is a 28 y.o. female PMH obesity, GERD presents for evaluation of headache - Patient has been having notable headache for the past 3 weeks.  No preceding trauma.  Not on thinners.  Occasional blurry vision, none currently.  No neck pain or stiffness.  No fevers. - Had been seen by her primary care provider for this, had an MRI on 03/07/2024 but had not yet been given results. - Presents to emergency department tonight because headache continues to worsen, notes it is severe when she attempts to lay down at night (10/10), 7/10 when sitting up.   MRI brain 03/07/24: IMPRESSION: 1. No evidence of an acute intracranial abnormality. 2. Expanded and partially empty sella turcica. This finding can reflect incidental anatomic variation, or alternatively, it can be associated with chronic idiopathic intracranial hypertension (pseudotumor cerebri). 3. 10 mm left sphenoid sinus mucous retention cyst.      Physical Exam   Triage Vital Signs: ED Triage Vitals  Encounter Vitals Group     BP 03/09/24 2212 132/77     Girls Systolic BP Percentile --      Girls Diastolic BP Percentile --      Boys Systolic BP Percentile --      Boys Diastolic BP Percentile --      Pulse Rate 03/09/24 2212 (!) 105     Resp 03/09/24 2212 18     Temp 03/09/24 2212 98.6 F (37 C)     Temp src --      SpO2 03/09/24 2212 100 %     Weight 03/09/24 2213 259 lb 4.2 oz (117.6 kg)     Height 03/09/24 2213 5' 3 (1.6 m)     Head Circumference --      Peak Flow --      Pain Score 03/09/24 2213 8     Pain Loc --      Pain Education --      Exclude from Growth Chart --     Most recent vital signs: Vitals:    03/10/24 0416 03/10/24 0600  BP: 114/71 108/72  Pulse: 89 87  Resp: 16   Temp: 97.8 F (36.6 C)   SpO2: 100% 100%     General: Awake, no distress.  HEENT: Normocephalic, atraumatic, no meningismus CV:  Good peripheral perfusion. RRR (HR 90s at time of my eval), RP 2+ Resp:  Normal effort.  Abd:  No distention. Neuro:  Aox4, CN II-XII intact, FNF wnl, finger taps fast b/l, 5/5 strength in bilateral finger extension/grip, arm flexion/extension, EHL/FHL. BUE AG 10+ sec no drift, BLE AG 5+ sec no drift. Ambulates with steady gait. SILT. Negative Rhomberg.    ED Results / Procedures / Treatments   Labs (all labs ordered are listed, but only abnormal results are displayed) Labs Reviewed  CBC - Abnormal; Notable for the following components:      Result Value   Platelets 410 (*)    All other components within normal limits  BASIC METABOLIC PANEL WITH GFR - Abnormal; Notable for the following components:   Glucose, Bld 117 (*)    All other components within  normal limits  HCG, QUANTITATIVE, PREGNANCY  POC URINE PREG, ED     EKG  N/a   RADIOLOGY N/a    PROCEDURES:  Critical Care performed: No  Procedures   MEDICATIONS ORDERED IN ED: Medications  prochlorperazine  (COMPAZINE ) injection 10 mg (10 mg Intravenous Given 03/10/24 0406)  ketorolac  (TORADOL ) 15 MG/ML injection 15 mg (15 mg Intravenous Given 03/10/24 0409)  acetaminophen  (TYLENOL ) tablet 1,000 mg (1,000 mg Oral Given 03/10/24 0405)     IMPRESSION / MDM / ASSESSMENT AND PLAN / ED COURSE  I reviewed the triage vital signs and the nursing notes.                              DDX/MDM/AP: Differential diagnosis includes, but is not limited to, migraine, recent MRI with no underlying concerning mass though does show expanded and partially empty sella turcica which can be seen in idiopathic intracranial hypertension.  No clinical concern for meningitis/encephalitis.  No interval trauma to suggest traumatic  injury.  Plan: - Labs - Pain control - No indication for repeat imaging at this time - will d/w neuro, anticipate she would be a very difficult ED lumbar puncture given habitus  Patient's presentation is most consistent with acute presentation with potential threat to life or bodily function.  The patient is on the cardiac monitor to evaluate for evidence of arrhythmia and/or significant heart rate changes.  ED course below.  Labs unremarkable and patient feeling better after initial medications.  Given concern for idiopathic intracranial hypertension I did consult the overnight teleneurologist who evaluated patient as well.  Shares my concern and given patient's reported intermittent blurry vision and he does recommend admission, MRI brain with and without contrast, MRV, and lumbar puncture after MRI --important to collect opening pressure when this is done.  Can consider Diamox  and outpatient neuro and ophthalmology follow-up if workup otherwise reassuring though defers to daytime neurology coverage for final recommendations once results are complete.  Admitted to hospitalist service.  MRI w/wo and MRV ordered  Clinical Course as of 03/10/24 [id number]  [facility] Mar 10, 2024  9561 Patient reevaluated, headache notably improved, now 2-3/10, able to lay down  Remains very well-appearing here  Discussed with neurologist to see if further workup might be indicated and to ensure patient is stable for outpatient follow-up and if so to facilitate this [MM]  [phone] Neurology consulted, teleneuro will eval pt regarding my question about possible idiopathic intracranial hypertension [MM]  0620 Spoke w/ Neuro -- Dr. Silva, evaluated pt by teleneuro - suspect IIH though abrupt onset is strange - given vision involvement, admit - recommend admission, rec MRI w/wo, rec MRV if possible (if not CTV) - LP after MRI, get opening pressure  If workup reassuring, can consider diamox  250 bid, outpt neuro f/u,  ophthalmology f/u -- defer to daytime neurology team on final recs  [MM]    Clinical Course User Index [MM] Clarine Ozell LABOR, MD     FINAL CLINICAL IMPRESSION(S) / ED DIAGNOSES   Final diagnoses:  Acute intractable headache, unspecified headache type     Rx / DC Orders   ED Discharge Orders     None        Note:  This document was prepared using Dragon voice recognition software and may include unintentional dictation errors.   Clarine Ozell LABOR, MD 03/10/24 [phone]

## 2024-03-10 NOTE — ED Notes (Signed)
Pt moved into a hospital bed.

## 2024-03-11 ENCOUNTER — Other Ambulatory Visit: Payer: Self-pay

## 2024-03-11 ENCOUNTER — Observation Stay

## 2024-03-11 DIAGNOSIS — G4733 Obstructive sleep apnea (adult) (pediatric): Secondary | ICD-10-CM

## 2024-03-11 DIAGNOSIS — G932 Benign intracranial hypertension: Secondary | ICD-10-CM

## 2024-03-11 DIAGNOSIS — R519 Headache, unspecified: Principal | ICD-10-CM

## 2024-03-11 LAB — CSF CELL COUNT WITH DIFFERENTIAL
RBC Count, CSF: 0 /mm3 (ref 0–3)
Tube #: 3
WBC, CSF: 8 /mm3 — ABNORMAL HIGH (ref 0–5)

## 2024-03-11 LAB — CBC
HCT: 37.4 % (ref 36.0–46.0)
Hemoglobin: 12.5 g/dL (ref 12.0–15.0)
MCH: 28.9 pg (ref 26.0–34.0)
MCHC: 33.4 g/dL (ref 30.0–36.0)
MCV: 86.6 fL (ref 80.0–100.0)
Platelets: 371 K/uL (ref 150–400)
RBC: 4.32 MIL/uL (ref 3.87–5.11)
RDW: 13.8 % (ref 11.5–15.5)
WBC: 7.4 K/uL (ref 4.0–10.5)
nRBC: 0 % (ref 0.0–0.2)

## 2024-03-11 LAB — PROTEIN AND GLUCOSE, CSF
Glucose, CSF: 56 mg/dL (ref 40–70)
Total  Protein, CSF: 15 mg/dL (ref 15–45)

## 2024-03-11 LAB — PROTIME-INR
INR: 1.1 (ref 0.8–1.2)
Prothrombin Time: 15 s (ref 11.4–15.2)

## 2024-03-11 LAB — BASIC METABOLIC PANEL WITH GFR
Anion gap: 5 (ref 5–15)
BUN: 10 mg/dL (ref 6–20)
CO2: 26 mmol/L (ref 22–32)
Calcium: 9.1 mg/dL (ref 8.9–10.3)
Chloride: 107 mmol/L (ref 98–111)
Creatinine, Ser: 0.53 mg/dL (ref 0.44–1.00)
GFR, Estimated: >60 mL/min
Glucose, Bld: 93 mg/dL (ref 70–99)
Potassium: 3.8 mmol/L (ref 3.5–5.1)
Sodium: 138 mmol/L (ref 135–145)

## 2024-03-11 LAB — HIV ANTIBODY (ROUTINE TESTING W REFLEX): HIV Screen 4th Generation wRfx: NONREACTIVE

## 2024-03-11 MED ORDER — ACETAZOLAMIDE 250 MG PO TABS
500.0000 mg | ORAL_TABLET | Freq: Two times a day (BID) | ORAL | 1 refills | Status: DC
  Filled 2024-03-11: qty 60, 15d supply, fill #0

## 2024-03-11 MED ORDER — LIDOCAINE 1 % OPTIME INJ - NO CHARGE
5.0000 mL | Freq: Once | INTRAMUSCULAR | Status: AC
  Administered 2024-03-11: 3 mL via SUBCUTANEOUS
  Filled 2024-03-11: qty 6

## 2024-03-11 NOTE — Progress Notes (Signed)
 NEUROLOGY CONSULT FOLLOW UP NOTE   Date of service: March 11, 2024 Patient Name: Jordan Jackson MRN:  [id number] DOB:  07-01-1995  Interval Hx/subjective   Patient continued to have headache.  Not as severe and seems to be relieved with Tylenol .  Positional in nature.  Reports vision is a little blurry in there morning when she wakes up but autocorrects.  Does not have blurry vision at this time.    Vitals   Vitals:   03/10/24 1642 03/10/24 2030 03/11/24 0418 03/11/24 0719  BP: 118/74 110/62 119/62 117/78  Pulse: 81 99 76 84  Resp: 16 18 18 18   Temp: 98 F (36.7 C) 97.9 F (36.6 C) (!) 97.5 F (36.4 C) 98.1 F (36.7 C)  TempSrc: Oral  Oral   SpO2: 98% 100% 100% 100%  Weight:      Height:         Body mass index is 45.93 kg/m.  Physical Exam   Constitutional: Appears well-developed and well-nourished.  Psych: Affect appropriate to situation.  Eyes: No scleral injection.  Head: Normocephalic.  Respiratory: Effort normal, non-labored breathing.  GI: Soft.  No distension. There is no tenderness.  Skin: WDI.   Neurologic Examination   Neurological Examination   Mental Status: Alert, oriented, thought content appropriate.  Speech fluent without evidence of aphasia.  Able to follow 3 step commands without difficulty. Cranial Nerves: II: Visual fields grossly normal, pupils equal, round, reactive to light and accommodation III,IV, VI: ptosis not present, extra-ocular motions intact bilaterally V,VII: smile symmetric, facial light touch sensation normal bilaterally VIII: hearing normal bilaterally XI: bilateral shoulder shrug XII: midline tongue extension Motor: Right : Upper extremity   5/5    Left:     Upper extremity   5/5  Lower extremity   5/5     Lower extremity   5/5 Tone and bulk:normal tone throughout; no atrophy noted Sensory: Pinprick and light touch intact throughout, bilaterally Deep Tendon Reflexes: Symmetric throughout Plantars: Right: mute   Left:  mute  Gait: normal gait and station      Medications  Current Facility-Administered Medications:    acetaminophen  (TYLENOL ) tablet 650 mg, 650 mg, Oral, Q6H PRN, 650 mg at 03/11/24 0853 **OR** acetaminophen  (TYLENOL ) suppository 650 mg, 650 mg, Rectal, Q6H PRN, Roann, Keshab, MD   butalbital -acetaminophen -caffeine  (FIORICET) 50-325-40 MG per tablet 1 tablet, 1 tablet, Oral, Q4H PRN, Paudel, Keshab, MD   enoxaparin  (LOVENOX ) injection 60 mg, 0.5 mg/kg, Subcutaneous, Q24H, Hunt, Madison H, RPH   ondansetron  (ZOFRAN ) tablet 4 mg, 4 mg, Oral, Q6H PRN **OR** ondansetron  (ZOFRAN ) injection 4 mg, 4 mg, Intravenous, Q6H PRN, Paudel, Keshab, MD   polyethylene glycol (MIRALAX  / GLYCOLAX ) packet 17 g, 17 g, Oral, Daily PRN, Roann Gouty, MD  Labs and Diagnostic Imaging   CBC:  Recent Labs  Lab 03/09/24 2215 03/11/24 0534  WBC 9.2 7.4  HGB 13.3 12.5  HCT 39.9 37.4  MCV 87.3 86.6  PLT 410* 371    Basic Metabolic Panel:  Lab Results  Component Value Date   NA 138 03/11/2024   K 3.8 03/11/2024   CO2 26 03/11/2024   GLUCOSE 93 03/11/2024   BUN 10 03/11/2024   CREATININE 0.53 03/11/2024   CALCIUM 9.1 03/11/2024   GFRNONAA >60 03/11/2024   GFRAA >60 01/31/2017   Lipid Panel:  Lab Results  Component Value Date   LDLCALC 99 11/16/2023   HgbA1c:  Lab Results  Component Value Date   HGBA1C 5.7 11/16/2023  Urine Drug Screen: No results found for: LABOPIA, COCAINSCRNUR, LABBENZ, AMPHETMU, THCU, LABBARB  Alcohol Level No results found for: [facility] INR  Lab Results  Component Value Date   INR 1.1 03/11/2024   APTT No results found for: APTT AED levels: No results found for: PHENYTOIN, ZONISAMIDE, LAMOTRIGINE, LEVETIRACETA   MRV head without contrast and Carotid Duplex BL(Personally reviewed): MRV BRAIN 03/10/2024 07:52:59 AM   TECHNIQUE: Multiplanar multisequence MRV of the head was performed with the administration of intravenous contrast.  Multiplanar 2D and 3D reformatted images are provided for review.   COMPARISON: MRI of the head dated 03/07/2024.   CLINICAL HISTORY: Severe headaches for 3 weeks, concern for idiopathic intracranial hypertension.   FINDINGS: No focal stenosis or thrombus is seen of the major dural venous sinuses.   IMPRESSION: 1. No evidence of venous sinus thrombosis.    MRI Brain(Personally reviewed): MRI BRAIN WITH AND WITHOUT CONTRAST 03/10/2024 07:52:59 AM   TECHNIQUE: Multiplanar multisequence MRI of the head/brain was performed with and without the administration of intravenous contrast.   COMPARISON: MRI of the brain dated 03/07/2024.   CLINICAL HISTORY: Severe headaches for 3 weeks, concern for idiopathic intracranial hypertension. 28 year old female presents with severe headache and blurry vision. Headaches started abruptly 3 weeks ago, have been waking up with headaches, and have progressed in severity/frequency. Also with now blurred vision that comes on in the morning.   FINDINGS:   BRAIN AND VENTRICLES: No acute infarct. No acute intracranial hemorrhage. No mass effect or midline shift. No hydrocephalus. Expanded, partially empty sella turcica is demonstrated. The pituitary gland enhances homogeneously and demonstrates no evidence of mass. The pituitary stalk is unremarkable. The brain parenchyma appears normal. Normal flow voids.   ORBITS: The optic nerves are nondilated.   SINUSES: Mucous retention cyst is demonstrated posteriorly within the left sphenoid sinus.   BONES AND SOFT TISSUES: Normal bone marrow signal and enhancement. No acute soft tissue abnormality.   IMPRESSION: 1. No acute intracranial abnormality. 2. Expanded, partially empty sella turcica, stable compared to prior study, with homogeneously enhancing pituitary gland and unremarkable pituitary stalk.    Assessment   Jordan Jackson is a 27 y.o. female presenting with headache and visual  blurring.  Non focal neurological examination.  MRI of the brain and MRV are unremarkable.  Patient to have LP today to rule out BIH.  If opening pressure is normal patient may be having headache related to OSA which is currently in the process of being worked up.    Recommendations  LP today.  If elevated OP will start Diamox  Patient to have ophthalmology evaluation on an outpatient basis.   ______________________________________________________________________   Signed, Myrta Mercer, MD [facility]

## 2024-03-11 NOTE — Procedures (Signed)
    PROCEDURE SUMMARY:   Successful image-guided lumbar puncture at level of L3/4  Opening pressure 23 cm water .  Yielded 11.5 mL of clear  CSF.  No immediate complications.  EBL = trace. Patient tolerated well.    Specimen was sent for labs.   Please see imaging section of Epic for full dictation.   Post procedure orders placed.  - Bedrest with bathroom privileges for 2 hours  - Remain flat in the bed for rest of the day

## 2024-03-11 NOTE — Discharge Summary (Signed)
 Jordan Jackson FMW:[id number] DOB: January 12, 1996 DOA: 03/10/2024  PCP: Job Lukes, PA  Admit date: 03/10/2024 Discharge date: 03/11/2024  Time spent: 35 minutes  Recommendations for Outpatient Follow-up:  Pcp f/u, check electrolytes in one week given start of acetazolamide , will need periodic monitoring of cbc if this becomes a long-term med Neurology f/u (referred to [facility] neurology)  Ophthalmology f/u (patient needs to call and schedule, given contact information for [facility])    Discharge Diagnoses:  Principal Problem:   Headache Active Problems:   Obesity (BMI 30-39.9)   HTN (hypertension)   Discharge Condition: improved  Diet recommendation: heart healthy  Filed Weights   03/09/24 2213  Weight: 117.6 kg    History of present illness:  From admission h and p Jordan Jackson is a pleasant 28 y.o. female with medical history significant for HTN, obesity who presented to ED at [facility] for severe headache and blurry vision.  Patient had similar symptoms and had an MRI done on 03/07/2024 for similar symptoms at [facility].  MRI of the brain did not show any acute pathology except there was finding of likely partial empty sella which could be related to underlying idiopathic intracranial hypertension.  Her headache symptoms started 3 weeks ago, has been waking up with headaches and it has been worsening severity and frequency now associated with blurry vision. Patient denies any fever, chills, nausea, vomiting, trauma to head.  She denies similar symptoms in the past.  She denies any migraine headaches.  Hospital Course:   Patient presents with headache. She was seen by neurology who advised MRI and MRV which were negative. LP also advised and was obtained on hospital day 1, revealing borderline elevated opening pressure of 23 cm, benign-appearing csf. Neurology advises treat presumptively for idiopathic intracranial hypertension with diamox , needs outpatient f/u with  ophthalmology for papilledema evaluation, and also with neurology.  Procedures: LP   Consultations: IR, neurology  Discharge Exam: Vitals:   03/11/24 0418 03/11/24 0719  BP: 119/62 117/78  Pulse: 76 84  Resp: 18 18  Temp: (!) 97.5 F (36.4 C) 98.1 F (36.7 C)  SpO2: 100% 100%    General: NAD Cardiovascular: RRR Respiratory: CTAB Neuro: non-focal  Discharge Instructions   Discharge Instructions     Diet - low sodium heart healthy   Complete by: As directed    Increase activity slowly   Complete by: As directed       Allergies as of 03/11/2024   No Known Allergies      Medication List     TAKE these medications    acetaZOLAMIDE  250 MG tablet Commonly known as: DIAMOX  Take 2 tablets (500 mg total) by mouth 2 (two) times daily.   amLODipine  5 MG tablet Commonly known as: NORVASC  Take 1 tablet (5 mg total) by mouth daily.   diazepam  2 MG tablet Commonly known as: Valium  Take 1 tablet 30 minutes prior to procedure. May take an additional tablet at start of procedure if needed. Do not drive after taking medication.   famotidine  10 MG tablet Commonly known as: PEPCID  Take 10 mg by mouth daily as needed for heartburn or indigestion.   fluticasone  0.05 % cream Commonly known as: CUTIVATE  Apply 1 Application to affected areas topically 2 (two) times daily as needed. (face)   halobetasol  0.05 % cream Commonly known as: ULTRAVATE  Apply 1 Application topically 2 (two) times daily as needed. (not to face, groin, axilla)   Mirena  (52 MG) 20 MCG/DAY Iud Generic  drug: levonorgestrel  1 each by Intrauterine route once. Inserted by GYN 11/2022, needs to be removed 11/2030   pantoprazole  40 MG tablet Commonly known as: PROTONIX  Take 1 tablet (40 mg total) by mouth daily.   sertraline  25 MG tablet Commonly known as: ZOLOFT  Take 1 tablet (25 mg total) by mouth daily.   Vitamin D  (Ergocalciferol ) 1.25 MG (50000 UNIT) Caps capsule Commonly known as:  DRISDOL  Take 1 capsule (50,000 Units total) by mouth every 7 (seven) days.       No Known Allergies  Follow-up Information     Job Lukes, [place] Follow up.   Specialty: Physician Assistant Why: hospital follow up Contact information: [address] [place] [address] [phone]                  The results of significant diagnostics from this hospitalization (including imaging, microbiology, ancillary and laboratory) are listed below for reference.    Significant Diagnostic Studies: MR MRV HEAD W WO CONTRAST Result Date: 03/10/2024 EXAM: MRV BRAIN 03/10/2024 07:52:59 AM TECHNIQUE: Multiplanar multisequence MRV of the head was performed with the administration of intravenous contrast. Multiplanar 2D and 3D reformatted images are provided for review. COMPARISON: MRI of the head dated 03/07/2024. CLINICAL HISTORY: Severe headaches for 3 weeks, concern for idiopathic intracranial hypertension. FINDINGS: No focal stenosis or thrombus is seen of the major dural venous sinuses. IMPRESSION: 1. No evidence of venous sinus thrombosis. Electronically signed by: Evalene Coho MD 03/10/2024 08:18 AM EDT RP Workstation: [id number]   MR Brain W and Wo Contrast Result Date: 03/10/2024 EXAM: MRI BRAIN WITH AND WITHOUT CONTRAST 03/10/2024 07:52:59 AM TECHNIQUE: Multiplanar multisequence MRI of the head/brain was performed with and without the administration of intravenous contrast. COMPARISON: MRI of the brain dated 03/07/2024. CLINICAL HISTORY: Severe headaches for 3 weeks, concern for idiopathic intracranial hypertension. 27 year old female presents with severe headache and blurry vision. Headaches started abruptly 3 weeks ago, have been waking up with headaches, and have progressed in severity/frequency. Also with now blurred vision that comes on in the morning. FINDINGS: BRAIN AND VENTRICLES: No acute infarct. No acute intracranial hemorrhage. No mass effect or midline shift. No  hydrocephalus. Expanded, partially empty sella turcica is demonstrated. The pituitary gland enhances homogeneously and demonstrates no evidence of mass. The pituitary stalk is unremarkable. The brain parenchyma appears normal. Normal flow voids. ORBITS: The optic nerves are nondilated. SINUSES: Mucous retention cyst is demonstrated posteriorly within the left sphenoid sinus. BONES AND SOFT TISSUES: Normal bone marrow signal and enhancement. No acute soft tissue abnormality. IMPRESSION: 1. No acute intracranial abnormality. 2. Expanded, partially empty sella turcica, stable compared to prior study, with homogeneously enhancing pituitary gland and unremarkable pituitary stalk. Electronically signed by: Evalene Coho MD 03/10/2024 08:14 AM EDT RP Workstation: [id number]   MR Brain Wo Contrast Result Date: 03/07/2024 CLINICAL DATA:  Provided history: Worsening headaches. Headache, increasing frequency or severity. EXAM: MRI HEAD WITHOUT CONTRAST TECHNIQUE: Multiplanar, multiecho pulse sequences of the brain and surrounding structures were obtained without intravenous contrast. COMPARISON:  None. FINDINGS: Brain: Cerebral volume is normal. Expanded and partially empty sella turcica. No cortical encephalomalacia is identified. No significant cerebral white matter disease. There is no acute infarct. No evidence of an intracranial mass. No chronic intracranial blood products. No extra-axial fluid collection. No midline shift. Vascular: Maintained flow voids within the proximal large arterial vessels. Skull and upper cervical spine: No focal worrisome marrow lesion. Sinuses/Orbits: No mass or acute finding within the imaged orbits. 10 mm mucous  retention cyst within the left maxillary sinus IMPRESSION: 1. No evidence of an acute intracranial abnormality. 2. Expanded and partially empty sella turcica. This finding can reflect incidental anatomic variation, or alternatively, it can be associated with chronic idiopathic  intracranial hypertension (pseudotumor cerebri). 3. 10 mm left sphenoid sinus mucous retention cyst. Electronically Signed   By: Rockey Childs D.O.   On: 03/07/2024 18:45    Microbiology: Recent Results (from the past 240 hours)  CSF culture w Gram Stain     Status: None (Preliminary result)   Collection Time: 03/11/24  1:29 PM   Specimen: A: PATH Cytology CSF; Cerebrospinal Fluid   B: PATH Cytology CSF; Cerebrospinal Fluid  Result Value Ref Range Status   Specimen Description CSF  Final   Special Requests NONE  Final   Gram Stain   Final    NO ORGANISMS SEEN RED BLOOD CELLS PRESENT WBC SEEN Performed at [facility], [address], [place], [place] [address]    Culture PENDING  Incomplete   Report Status PENDING  Incomplete     Labs: Basic Metabolic Panel: Recent Labs  Lab 03/09/24 2215 03/11/24 0534  NA 137 138  K 3.5 3.8  CL 104 107  CO2 24 26  GLUCOSE 117* 93  BUN 13 10  CREATININE 0.71 0.53  CALCIUM 9.0 9.1   Liver Function Tests: No results for input(s): AST, ALT, ALKPHOS, BILITOT, PROT, ALBUMIN in the last 168 hours. No results for input(s): LIPASE, AMYLASE in the last 168 hours. No results for input(s): AMMONIA in the last 168 hours. CBC: Recent Labs  Lab 03/09/24 2215 03/11/24 0534  WBC 9.2 7.4  HGB 13.3 12.5  HCT 39.9 37.4  MCV 87.3 86.6  PLT 410* 371   Cardiac Enzymes: No results for input(s): CKTOTAL, CKMB, CKMBINDEX, TROPONINI in the last 168 hours. BNP: BNP (last 3 results) No results for input(s): BNP in the last 8760 hours.  ProBNP (last 3 results) No results for input(s): PROBNP in the last 8760 hours.  CBG: No results for input(s): GLUCAP in the last 168 hours.     Signed:  Devaughn KATHEE Ban MD.  [facility] 03/11/2024, 3:43 PM

## 2024-03-12 ENCOUNTER — Other Ambulatory Visit: Payer: Self-pay | Admitting: Physician Assistant

## 2024-03-12 ENCOUNTER — Telehealth: Payer: Self-pay

## 2024-03-12 ENCOUNTER — Other Ambulatory Visit: Payer: Self-pay

## 2024-03-12 ENCOUNTER — Encounter: Payer: Self-pay | Admitting: Physician Assistant

## 2024-03-12 DIAGNOSIS — G932 Benign intracranial hypertension: Secondary | ICD-10-CM

## 2024-03-12 NOTE — Transitions of Care (Post Inpatient/ED Visit) (Signed)
 03/12/2024  Name: Jordan Jackson MRN: [id number] DOB: [birth year]  Today's TOC FU Call Status: Today's TOC FU Call Status:: Successful TOC FU Call Completed TOC FU Call Complete Date: 03/12/24 Patient's Name and Date of Birth confirmed.  Transition Care Management Follow-up Telephone Call Date of Discharge: 03/11/24 Discharge Facility: [facility]) Type of Discharge: Inpatient Admission Primary Inpatient Discharge Diagnosis:: headache How have you been since you were released from the hospital?: Better Any questions or concerns?: No  Items Reviewed: Did you receive and understand the discharge instructions provided?: Yes Medications obtained,verified, and reconciled?: Yes (Medications Reviewed) Any new allergies since your discharge?: No Dietary orders reviewed?: Yes Do you have support at home?: Yes People in Home [RPT]: spouse  Medications Reviewed Today: Medications Reviewed Today     Reviewed by Emmitt Pan, LPN (Licensed Practical Nurse) on 03/12/24 at 1425  Med List Status: <None>   Medication Order Taking? Sig Documenting Provider Last Dose Status Informant  acetaZOLAMIDE  (DIAMOX ) 250 MG tablet [id number] Yes Take 2 tablets (500 mg total) by mouth 2 (two) times daily. Wouk, Devaughn Sayres, MD  Active   amLODipine  (NORVASC ) 5 MG tablet [id number] Yes Take 1 tablet (5 mg total) by mouth daily. Job Lukes, PA  Active Self, Pharmacy Records  diazepam  (VALIUM ) 2 MG tablet [id number]  Take 1 tablet 30 minutes prior to procedure. May take an additional tablet at start of procedure if needed. Do not drive after taking medication.  Patient not taking: Reported on 03/12/2024   Job Lukes, PA  Active Self, Pharmacy Records  famotidine  (PEPCID ) 10 MG tablet [id number] Yes Take 10 mg by mouth daily as needed for heartburn or indigestion. [provider]  Active Self, Pharmacy Records  fluticasone  (CUTIVATE ) 0.05 % cream [id number] Yes Apply  1 Application to affected areas topically 2 (two) times daily as needed. (face)   Active Self, Pharmacy Records  halobetasol  (ULTRAVATE ) 0.05 % cream [id number] Yes Apply 1 Application topically 2 (two) times daily as needed. (not to face, groin, axilla)   Active Self, Pharmacy Records  levonorgestrel  (MIRENA , 52 MG,) 20 MCG/DAY IUD [id number] Yes 1 each by Intrauterine route once. Inserted by GYN 11/2022, needs to be removed 11/2030 [provider]  Active Self, Pharmacy Records  pantoprazole  (PROTONIX ) 40 MG tablet [id number] Yes Take 1 tablet (40 mg total) by mouth daily. Job Lukes, PA  Active Self, Pharmacy Records  sertraline  (ZOLOFT ) 25 MG tablet [id number] Yes Take 1 tablet (25 mg total) by mouth daily. Job Lukes, PA  Active Self, Pharmacy Records  Vitamin D , Ergocalciferol , (DRISDOL ) 1.25 MG (50000 UNIT) CAPS capsule [id number] Yes Take 1 capsule (50,000 Units total) by mouth every 7 (seven) days. Job Lukes, PA  Active Self, Pharmacy Records            Home Care and Equipment/Supplies: Were Home Health Services Ordered?: NA Any new equipment or medical supplies ordered?: NA  Functional Questionnaire: Do you need assistance with bathing/showering or dressing?: No Do you need assistance with meal preparation?: No Do you need assistance with eating?: No Do you have difficulty maintaining continence: No Do you need assistance with getting out of bed/getting out of a chair/moving?: No Do you have difficulty managing or taking your medications?: No  Follow up appointments reviewed: PCP Follow-up appointment confirmed?: Yes Date of PCP follow-up appointment?: 03/18/24 Follow-up Provider: [facility] Follow-up appointment confirmed?: NA Do you need transportation to your follow-up appointment?: No Do you understand care options if your  condition(s) worsen?: Yes-patient verbalized understanding    SIGNATURE Julian Lemmings, LPN [facility] Nurse  Health Advisor Direct Dial [phone]

## 2024-03-13 ENCOUNTER — Telehealth: Payer: Self-pay | Admitting: *Deleted

## 2024-03-13 NOTE — Telephone Encounter (Signed)
 Message sent to referral coordinator to resend referral to [facility].

## 2024-03-13 NOTE — Telephone Encounter (Unsigned)
 Copied from CRM [id number]. Topic: Referral - Status >> Mar 13, 2024  1:38 PM Berneda FALCON wrote: Reason for CRM: Patient stated the eye Dr did not get the referral but I do see it was sent and verified it was sent to the right place for her. They also need the notes as to why she needs the referral please.  Can we resend the referral to [phone]

## 2024-03-14 DIAGNOSIS — R519 Headache, unspecified: Secondary | ICD-10-CM | POA: Diagnosis not present

## 2024-03-14 DIAGNOSIS — G932 Benign intracranial hypertension: Secondary | ICD-10-CM | POA: Diagnosis not present

## 2024-03-14 LAB — CSF CULTURE W GRAM STAIN
Culture: NO GROWTH
Gram Stain: NONE SEEN

## 2024-03-14 NOTE — Telephone Encounter (Signed)
 Office notes and referral faxed to [facility].

## 2024-03-14 NOTE — Telephone Encounter (Signed)
 Spoke to pt told her office notes and referral faxed over to [facility] this morning. Pt verbalized understanding.

## 2024-03-18 ENCOUNTER — Ambulatory Visit: Admitting: Physician Assistant

## 2024-03-18 ENCOUNTER — Encounter: Payer: Self-pay | Admitting: Physician Assistant

## 2024-03-18 ENCOUNTER — Other Ambulatory Visit: Payer: Self-pay

## 2024-03-18 VITALS — BP 130/90 | HR 88 | Temp 97.5°F | Ht 63.0 in | Wt 254.0 lb

## 2024-03-18 DIAGNOSIS — G43709 Chronic migraine without aura, not intractable, without status migrainosus: Secondary | ICD-10-CM

## 2024-03-18 DIAGNOSIS — G932 Benign intracranial hypertension: Secondary | ICD-10-CM

## 2024-03-18 LAB — COMPREHENSIVE METABOLIC PANEL WITH GFR
ALT: 22 U/L (ref 0–35)
AST: 18 U/L (ref 0–37)
Albumin: 4.7 g/dL (ref 3.5–5.2)
Alkaline Phosphatase: 102 U/L (ref 39–117)
BUN: 17 mg/dL (ref 6–23)
CO2: 20 meq/L (ref 19–32)
Calcium: 9.7 mg/dL (ref 8.4–10.5)
Chloride: 106 meq/L (ref 96–112)
Creatinine, Ser: 0.71 mg/dL (ref 0.40–1.20)
GFR: 114.92 mL/min (ref >60.00)
Glucose, Bld: 94 mg/dL (ref 70–99)
Potassium: 3.7 meq/L (ref 3.5–5.1)
Sodium: 135 meq/L (ref 135–145)
Total Bilirubin: 0.3 mg/dL (ref 0.2–1.2)
Total Protein: 8.6 g/dL — ABNORMAL HIGH (ref 6.0–8.3)

## 2024-03-18 MED ORDER — QULIPTA 60 MG PO TABS
60.0000 mg | ORAL_TABLET | Freq: Every day | ORAL | 1 refills | Status: DC
  Filled 2024-03-18 – 2024-03-21 (×2): qty 30, 30d supply, fill #0
  Filled 2024-05-29: qty 30, 30d supply, fill #1

## 2024-03-18 MED ORDER — ONDANSETRON HCL 4 MG PO TABS
4.0000 mg | ORAL_TABLET | Freq: Three times a day (TID) | ORAL | 0 refills | Status: AC | PRN
  Filled 2024-03-18: qty 20, 7d supply, fill #0

## 2024-03-18 NOTE — Progress Notes (Signed)
 Jordan Jackson is a 28 y.o. female here for a follow up of a pre-existing problem.  History of Present Illness:   Chief Complaint  Patient presents with  . Hospitalization Follow-up    Pt was admitted on 03/10/2024 for Acute Intractable Headache, discharged on 03/11/2024. Pt c/o headache everyday since discharged, has taken Excedrin Migraine generic with relief. Has slight headache today, feels better than yesterday.    Discussed the use of AI scribe software for clinical note transcription with the patient, who gave verbal consent to proceed.  History of Present Illness Jordan Jackson is a 29 year old female who presents with recurrent headaches and nausea.  She experiences severe headaches with intense pressure, described as 'someone dangling by my feet' and 'keeping all the stuff in my head.' The headaches initially improved after an emergency department visit but returned with significant intensity, causing her to wake up with a 'terrible headache' that persists throughout the night. Nurtec provides partial relief, while Excedrin Migraine offers temporary relief before the headache returns. Overall having at least 4-6 headache days per week at this time.  She experiences overwhelming nausea, particularly in the mornings, and uses Dramamine chews for management, though swallowing them is difficult due to nausea. She feels 'motion sick all day.'  She went to the ER on 03/10/24 for ongoing headache(s). An MRI, MRV, and lumbar puncture during her emergency department visit revealed a borderline elevated opening pressure. She was started on Diamox  250 mg twice daily  but is struggling with the side effects like lightheadedness and excessive sweating. Her water  intake has increased. An ophthalmologist found no signs of papilledema. She is awaiting a neurology appointment for further evaluation.  She is currently taking amlodipine  5 for blood pressure management, which was well-controlled during  her hospital stay.    Past Medical History:  Diagnosis Date  . Fatty liver 10/24/2016   Refer to GI  . Gastritis    easinophilic   . GERD (gastroesophageal reflux disease)   . Irregular periods/menstrual cycles 08/10/2015  . Menstrual cramps 08/10/2015  . Pelvic pain in female      Social History   Tobacco Use  . Smoking status: Never  . Smokeless tobacco: Never  Vaping Use  . Vaping status: Never Used  Substance Use Topics  . Alcohol use: Not Currently  . Drug use: No    Past Surgical History:  Procedure Laterality Date  . BIOPSY  12/26/2016   Procedure: BIOPSY;  Surgeon: Harvey Margo CROME, MD;  [facility];  Service: Endoscopy;;  gastric duodenal  . COLONOSCOPY N/A 12/26/2016   Procedure: COLONOSCOPY;  Surgeon: Harvey Margo CROME, MD;  Location: [facility];  Service: Endoscopy;  Laterality: N/A;  1:00pm  . ESOPHAGOGASTRODUODENOSCOPY N/A 12/26/2016   Procedure: ESOPHAGOGASTRODUODENOSCOPY (EGD);  Surgeon: Harvey Margo CROME, MD;  Location: [facility];  Service: Endoscopy;  Laterality: N/A;  . NO PAST SURGERIES      Family History  Problem Relation Age of Onset  . Hypertension Mother   . Arthritis Mother   . Hypertension Father   . Diabetes Maternal Grandmother   . Hypertension Paternal Grandmother   . Hypertension Paternal Grandfather   . Diabetes Paternal Grandfather   . Inflammatory bowel disease Neg Hx   . Liver disease Neg Hx   . Celiac disease Neg Hx   . Colon cancer Neg Hx     No Known Allergies  Current Medications:   Current Outpatient Medications:  .  acetaZOLAMIDE  (DIAMOX ) 250 MG tablet, Take 2 tablets (500 mg total) by mouth 2 (two) times daily., Disp: 60 tablet, Rfl: 1 .  amLODipine  (NORVASC ) 5 MG tablet, Take 1 tablet (5 mg total) by mouth daily., Disp: 30 tablet, Rfl: 1 .  Atogepant  (QULIPTA ) 60 MG TABS, Take 1 tablet (60 mg total) by mouth daily., Disp: 30 tablet, Rfl: 1 .  famotidine  (PEPCID ) 10 MG tablet, Take 10 mg by mouth  daily as needed for heartburn or indigestion., Disp: , Rfl:  .  fluticasone  (CUTIVATE ) 0.05 % cream, Apply 1 Application to affected areas topically 2 (two) times daily as needed. (face), Disp: 60 g, Rfl: 3 .  halobetasol  (ULTRAVATE ) 0.05 % cream, Apply 1 Application topically 2 (two) times daily as needed. (not to face, groin, axilla), Disp: 60 g, Rfl: 3 .  levonorgestrel  (MIRENA , 52 MG,) 20 MCG/DAY IUD, 1 each by Intrauterine route once. Inserted by GYN 11/2022, needs to be removed 11/2030, Disp: , Rfl:  .  ondansetron  (ZOFRAN ) 4 MG tablet, Take 1 tablet (4 mg total) by mouth every 8 (eight) hours as needed for nausea or vomiting., Disp: 20 tablet, Rfl: 0 .  pantoprazole  (PROTONIX ) 40 MG tablet, Take 1 tablet (40 mg total) by mouth daily., Disp: 30 tablet, Rfl: 3 .  sertraline  (ZOLOFT ) 25 MG tablet, Take 1 tablet (25 mg total) by mouth daily., Disp: 30 tablet, Rfl: 1 .  Vitamin D , Ergocalciferol , (DRISDOL ) 1.25 MG (50000 UNIT) CAPS capsule, Take 1 capsule (50,000 Units total) by mouth every 7 (seven) days., Disp: 12 capsule, Rfl: 0   Review of Systems:   Negative unless otherwise specified per HPI.  Vitals:   Vitals:   03/18/24 1031  BP: (!) 130/90  Pulse: 88  Temp: (!) 97.5 F (36.4 C)  TempSrc: Temporal  SpO2: 99%  Weight: 254 lb (115.2 kg)  Height: 5' 3 (1.6 m)     Body mass index is 44.99 kg/m.  Physical Exam:   Physical Exam Vitals and nursing note reviewed.  Constitutional:      General: She is not in acute distress.    Appearance: She is well-developed. She is not ill-appearing or toxic-appearing.  Cardiovascular:     Rate and Rhythm: Normal rate and regular rhythm.     Pulses: Normal pulses.     Heart sounds: Normal heart sounds, S1 normal and S2 normal.  Pulmonary:     Effort: Pulmonary effort is normal.     Breath sounds: Normal breath sounds.  Skin:    General: Skin is warm and dry.  Neurological:     Mental Status: She is alert.     GCS: GCS eye subscore is  4. GCS verbal subscore is 5. GCS motor subscore is 6.  Psychiatric:        Speech: Speech normal.        Behavior: Behavior normal. Behavior is cooperative.     Assessment and Plan:   Assessment and Plan Assessment & Plan Chronic migraine without aura without status migrainosus, not intractable Persistent headaches with severe episodes, partially responsive to Nurtec. Cannot take TCA's due to Zoloft  use. She would like to avoid topamax or beta blockers due to concerns for grogginess. Risk of rebound headaches from frequent analgesic use. - Initiate Qulipta  for prevention. - Educated on limiting analgesic use to prevent rebound headaches. - Discussed potential FMLA need due to work impact. Will need to cover for missed days of work on 9/17 and 9/19. She would like to be  able to take up to 12 hours (1 shift) off per week while working on better migraine management. - Prescribe Zofran  for management.  Idiopathic intracranial hypertension? Borderline elevated opening pressure on lumbar puncture. Diamox  poorly tolerated. No papilledema on ophthalmology follow-up. - Stop diamox  due to intolerance - Resend neurology referral as stat for expedited appointment. - Monitor electrolytes due to acetazolamide  use, in case we restart it      She verbalizes understanding to go to the ER if symptom(s) worsen  I personally spent a total of 52 minutes in the care of the patient today including preparing to see the patient, getting/reviewing separately obtained history, performing a medically appropriate exam/evaluation, counseling and educating, placing orders, and documenting clinical information in the EHR.     Lucie Buttner, PA-C

## 2024-03-19 ENCOUNTER — Ambulatory Visit: Payer: Self-pay | Admitting: Physician Assistant

## 2024-03-19 ENCOUNTER — Telehealth: Payer: Self-pay

## 2024-03-19 NOTE — Telephone Encounter (Signed)
 Pharmacy Patient Advocate Encounter   Received notification from Onbase that prior authorization for Qulipta  60MG  tablets is required/requested.   Insurance verification completed.   The patient is insured through [facility] .   Per test claim: PA required; PA submitted to above mentioned insurance via Latent Key/confirmation #/EOC [id number] Status is pending

## 2024-03-19 NOTE — Telephone Encounter (Signed)
 Pharmacy Patient Advocate Encounter  Received notification from [facility] that Prior Authorization for Qulipta  60MG  tablets  has been APPROVED from 03/19/24 to 09/15/24   PA #/Case ID/Reference #: [id number]

## 2024-03-21 ENCOUNTER — Other Ambulatory Visit: Payer: Self-pay

## 2024-03-22 ENCOUNTER — Encounter

## 2024-03-22 DIAGNOSIS — G4733 Obstructive sleep apnea (adult) (pediatric): Secondary | ICD-10-CM

## 2024-04-09 DIAGNOSIS — G4733 Obstructive sleep apnea (adult) (pediatric): Secondary | ICD-10-CM | POA: Diagnosis not present

## 2024-04-10 ENCOUNTER — Ambulatory Visit: Payer: Self-pay

## 2024-04-10 DIAGNOSIS — G4733 Obstructive sleep apnea (adult) (pediatric): Secondary | ICD-10-CM

## 2024-04-16 ENCOUNTER — Encounter: Payer: Self-pay | Admitting: Physician Assistant

## 2024-04-16 DIAGNOSIS — J029 Acute pharyngitis, unspecified: Secondary | ICD-10-CM

## 2024-04-17 NOTE — Telephone Encounter (Signed)
 Okay for ENT referral

## 2024-04-23 ENCOUNTER — Other Ambulatory Visit: Payer: Self-pay

## 2024-04-23 DIAGNOSIS — G43009 Migraine without aura, not intractable, without status migrainosus: Secondary | ICD-10-CM | POA: Diagnosis not present

## 2024-04-23 DIAGNOSIS — Z1331 Encounter for screening for depression: Secondary | ICD-10-CM | POA: Diagnosis not present

## 2024-04-23 DIAGNOSIS — R519 Headache, unspecified: Secondary | ICD-10-CM | POA: Diagnosis not present

## 2024-04-23 DIAGNOSIS — G4484 Primary exertional headache: Secondary | ICD-10-CM | POA: Diagnosis not present

## 2024-04-23 DIAGNOSIS — Z23 Encounter for immunization: Secondary | ICD-10-CM | POA: Diagnosis not present

## 2024-04-23 DIAGNOSIS — G932 Benign intracranial hypertension: Secondary | ICD-10-CM | POA: Diagnosis not present

## 2024-04-23 DIAGNOSIS — R51 Headache with orthostatic component, not elsewhere classified: Secondary | ICD-10-CM | POA: Diagnosis not present

## 2024-04-23 DIAGNOSIS — R42 Dizziness and giddiness: Secondary | ICD-10-CM | POA: Diagnosis not present

## 2024-04-23 MED ORDER — TOPIRAMATE 25 MG PO TABS
ORAL_TABLET | ORAL | 2 refills | Status: AC
  Filled 2024-04-23: qty 90, 37d supply, fill #0
  Filled 2024-07-04: qty 90, 30d supply, fill #1

## 2024-04-24 ENCOUNTER — Other Ambulatory Visit: Payer: Self-pay | Admitting: Physician Assistant

## 2024-04-24 DIAGNOSIS — G4484 Primary exertional headache: Secondary | ICD-10-CM

## 2024-04-25 ENCOUNTER — Encounter: Admitting: Physician Assistant

## 2024-05-02 ENCOUNTER — Ambulatory Visit: Admission: RE | Admit: 2024-05-02 | Source: Ambulatory Visit

## 2024-05-05 DIAGNOSIS — G4733 Obstructive sleep apnea (adult) (pediatric): Secondary | ICD-10-CM | POA: Diagnosis not present

## 2024-06-02 ENCOUNTER — Encounter (INDEPENDENT_AMBULATORY_CARE_PROVIDER_SITE_OTHER): Payer: Self-pay

## 2024-06-02 ENCOUNTER — Telehealth (INDEPENDENT_AMBULATORY_CARE_PROVIDER_SITE_OTHER): Payer: Self-pay

## 2024-06-02 NOTE — Telephone Encounter (Signed)
 I left a detailed message for the patient to call us  back, I let her know our office was opening late (10 am) and we need to reschedule her appointment.

## 2024-06-03 ENCOUNTER — Institutional Professional Consult (permissible substitution) (INDEPENDENT_AMBULATORY_CARE_PROVIDER_SITE_OTHER)

## 2024-06-04 DIAGNOSIS — G4733 Obstructive sleep apnea (adult) (pediatric): Secondary | ICD-10-CM | POA: Diagnosis not present

## 2024-06-15 ENCOUNTER — Other Ambulatory Visit: Payer: Self-pay

## 2024-06-15 ENCOUNTER — Telehealth: Admitting: Physician Assistant

## 2024-06-15 DIAGNOSIS — R3989 Other symptoms and signs involving the genitourinary system: Secondary | ICD-10-CM

## 2024-06-15 MED ORDER — CEPHALEXIN 500 MG PO CAPS
500.0000 mg | ORAL_CAPSULE | Freq: Two times a day (BID) | ORAL | 0 refills | Status: AC
  Filled 2024-06-15: qty 14, 7d supply, fill #0

## 2024-06-15 NOTE — Progress Notes (Signed)

## 2024-07-01 ENCOUNTER — Institutional Professional Consult (permissible substitution) (INDEPENDENT_AMBULATORY_CARE_PROVIDER_SITE_OTHER)

## 2024-07-02 ENCOUNTER — Telehealth: Payer: Self-pay | Admitting: Physician Assistant

## 2024-07-02 NOTE — Telephone Encounter (Signed)
 [employer] faxed FMLA, to be filled out by provider. Patient requested to send it back via Fax within ASAP. Document is located in providers tray at front office.Please advise at [phone].

## 2024-07-04 ENCOUNTER — Other Ambulatory Visit (HOSPITAL_COMMUNITY): Payer: Self-pay

## 2024-07-04 ENCOUNTER — Other Ambulatory Visit: Payer: Self-pay | Admitting: Physician Assistant

## 2024-07-04 ENCOUNTER — Other Ambulatory Visit: Payer: Self-pay

## 2024-07-04 ENCOUNTER — Ambulatory Visit: Admitting: Physician Assistant

## 2024-07-04 ENCOUNTER — Encounter: Payer: Self-pay | Admitting: Physician Assistant

## 2024-07-04 VITALS — BP 130/84 | HR 94 | Temp 98.0°F | Ht 63.0 in | Wt 254.0 lb

## 2024-07-04 DIAGNOSIS — R41840 Attention and concentration deficit: Secondary | ICD-10-CM | POA: Diagnosis not present

## 2024-07-04 DIAGNOSIS — G43709 Chronic migraine without aura, not intractable, without status migrainosus: Secondary | ICD-10-CM | POA: Diagnosis not present

## 2024-07-04 DIAGNOSIS — G4733 Obstructive sleep apnea (adult) (pediatric): Secondary | ICD-10-CM | POA: Diagnosis not present

## 2024-07-04 DIAGNOSIS — I10 Essential (primary) hypertension: Secondary | ICD-10-CM

## 2024-07-04 DIAGNOSIS — F419 Anxiety disorder, unspecified: Secondary | ICD-10-CM | POA: Diagnosis not present

## 2024-07-04 MED ORDER — QULIPTA 60 MG PO TABS
60.0000 mg | ORAL_TABLET | Freq: Every day | ORAL | 2 refills | Status: AC
  Filled 2024-07-04 (×2): qty 30, 30d supply, fill #0

## 2024-07-04 MED ORDER — AMLODIPINE BESYLATE 5 MG PO TABS
5.0000 mg | ORAL_TABLET | Freq: Every day | ORAL | 2 refills | Status: AC
  Filled 2024-07-04 (×2): qty 30, 30d supply, fill #0

## 2024-07-04 MED ORDER — ATOMOXETINE HCL 10 MG PO CAPS
10.0000 mg | ORAL_CAPSULE | Freq: Every day | ORAL | 0 refills | Status: DC
  Filled 2024-07-04 (×3): qty 30, 30d supply, fill #0

## 2024-07-04 MED ORDER — SERTRALINE HCL 25 MG PO TABS
25.0000 mg | ORAL_TABLET | Freq: Every day | ORAL | 2 refills | Status: AC
  Filled 2024-07-04 (×2): qty 30, 30d supply, fill #0

## 2024-07-04 NOTE — Progress Notes (Signed)
 "  History of Present Illness:   Chief Complaint  Patient presents with   Memory Loss    Pt has been experienced memory loss, foggy head, no motivation, she is not sure what is going on, has been for the past year, but getting worse.     Discussed the use of AI scribe software for clinical note transcription with the patient, who gave verbal consent to proceed.  History of Present Illness   Jordan Jackson is a 29 year old female who presents with concerns about ADHD symptoms and medication management.  She reports longstanding difficulty with routines, chronic procrastination, forgetfulness, and feeling time blind, which interfere with maintaining a schedule and completing responsibilities. She has been late to work multiple times and often feels overwhelmed and stuck when faced with tasks. Intrusive thoughts and sound sensitivity have improved on Zoloft . Her partner and others have noticed memory issues, and she feels her thoughts are jumbled, causing frustration and decision fatigue. She notes these problems have been present since her teenage years.  She has migraines managed by neurology. She started Topamax  in October and is now on 75 mg nightly with marked reduction in headaches, which recur if she misses a dose. She is also taking Qulipta  and Zoloft  and has an MRA planned.  She has sleep apnea and has used CPAP for about two months, with clear symptomatic improvement. She occasionally feels claustrophobic at night but continues use.  She denies depression but notes low motivation and energy, especially after caring for her child. She does not have excessive daytime sleepiness since starting CPAP.        Past Medical History:  Diagnosis Date   Fatty liver 10/24/2016   Refer to GI   Gastritis    easinophilic    GERD (gastroesophageal reflux disease)    Irregular periods/menstrual cycles 08/10/2015   Menstrual cramps 08/10/2015   Pelvic pain in female      Social  History[1]  Past Surgical History:  Procedure Laterality Date   BIOPSY  12/26/2016   Procedure: BIOPSY;  Surgeon: Harvey Margo CROME, MD;  [facility];  Service: Endoscopy;;  gastric duodenal   COLONOSCOPY N/A 12/26/2016   Procedure: COLONOSCOPY;  Surgeon: Harvey Margo CROME, MD;  [facility]: [facility];  Service: Endoscopy;  Laterality: N/A;  1:00pm   ESOPHAGOGASTRODUODENOSCOPY N/A 12/26/2016   Procedure: ESOPHAGOGASTRODUODENOSCOPY (EGD);  Surgeon: Harvey Margo CROME, MD;  Location: [facility];  Service: Endoscopy;  Laterality: N/A;   NO PAST SURGERIES      Family History  Problem Relation Age of Onset   Hypertension Mother    Arthritis Mother    Hypertension Father    Diabetes Maternal Grandmother    Hypertension Paternal Grandmother    Hypertension Paternal Grandfather    Diabetes Paternal Grandfather    Inflammatory bowel disease Neg Hx    Liver disease Neg Hx    Celiac disease Neg Hx    Colon cancer Neg Hx     Allergies[2]  Current Medications:  Current Medications[3]   Review of Systems:   Negative unless otherwise specified per HPI.  Vitals:   Vitals:   07/04/24 1424  BP: 130/84  Pulse: 94  Temp: 98 F (36.7 C)  TempSrc: Temporal  SpO2: 99%  Weight: 254 lb (115.2 kg)  Height: 5' 3 (1.6 m)     Body mass index is 44.99 kg/m.  Physical Exam:   Physical Exam Vitals and nursing note reviewed.  Constitutional:  General: She is not in acute distress.    Appearance: She is well-developed. She is not ill-appearing or toxic-appearing.  Cardiovascular:     Rate and Rhythm: Normal rate and regular rhythm.     Pulses: Normal pulses.     Heart sounds: Normal heart sounds, S1 normal and S2 normal.  Pulmonary:     Effort: Pulmonary effort is normal.     Breath sounds: Normal breath sounds.  Skin:    General: Skin is warm and dry.  Neurological:     Mental Status: She is alert.     GCS: GCS eye subscore is 4. GCS verbal subscore is 5. GCS  motor subscore is 6.  Psychiatric:        Speech: Speech normal.        Behavior: Behavior normal. Behavior is cooperative.     Assessment and Plan:   Assessment and Plan    Attention or concentration deficit  Symptoms consistent with ADHD impacting daily functioning. High suspicion based on questionnaire. Discussed Strattera  vs increase Zoloft  vs low-dose Vyvanse. - Started Strattera  10 mg daily, option to increase to 20 mg or 30 mg based on response. - Instructed to monitor for side effects and effectiveness. - Consider referral for ADHD diagnosis if needed. - Discuss potential for therapy to develop coping skills.  Chronic migraine without aura without status migrainosus, not intractable  Chronic migraines managed with Qulipta  and Topamax . Topamax  effective but causes fatigue. - Continue Qulipta  as prescribed. - Continue Topamax  75 mg at night. - Proceed with scheduled MRA.  Severe obstructive sleep apnea Managed with CPAP therapy, positive changes in sleep quality and alertness. - Continue CPAP therapy.  Essential hypertension Blood pressure well-controlled with current medication regimen. - Continue current antihypertensive regimen of amlodipine  5 mg daily - Currently well controlled  Anxiety  Managed with Zoloft , beneficial in improving mood and reducing anxiety. - Continue Zoloft  25 mg as prescribed.  - Consider increase in the future if needed - reports there is no suicidal ideation/hi      Lucie Buttner, PA-C    [1]  Social History Tobacco Use   Smoking status: Never   Smokeless tobacco: Never  Vaping Use   Vaping status: Never Used  Substance Use Topics   Alcohol use: Not Currently   Drug use: No  [2] No Known Allergies [3]  Current Outpatient Medications:    amLODipine  (NORVASC ) 5 MG tablet, Take 1 tablet (5 mg total) by mouth daily., Disp: 30 tablet, Rfl: 2   Atogepant  (QULIPTA ) 60 MG TABS, Take 1 tablet (60 mg total) by mouth daily., Disp: 30  tablet, Rfl: 2   atomoxetine  (STRATTERA ) 10 MG capsule, Take 1 capsule (10 mg total) by mouth daily., Disp: 30 capsule, Rfl: 0   famotidine  (PEPCID ) 10 MG tablet, Take 10 mg by mouth daily as needed for heartburn or indigestion., Disp: , Rfl:    fluticasone  (CUTIVATE ) 0.05 % cream, Apply 1 Application to affected areas topically 2 (two) times daily as needed. (face), Disp: 60 g, Rfl: 3   halobetasol  (ULTRAVATE ) 0.05 % cream, Apply 1 Application topically 2 (two) times daily as needed. (not to face, groin, axilla), Disp: 60 g, Rfl: 3   levonorgestrel  (MIRENA , 52 MG,) 20 MCG/DAY IUD, 1 each by Intrauterine route once. Inserted by GYN 11/2022, needs to be removed 11/2030, Disp: , Rfl:    ondansetron  (ZOFRAN ) 4 MG tablet, Take 1 tablet (4 mg total) by mouth every 8 (eight) hours as needed for nausea  or vomiting., Disp: 20 tablet, Rfl: 0   pantoprazole  (PROTONIX ) 40 MG tablet, Take 1 tablet (40 mg total) by mouth daily., Disp: 30 tablet, Rfl: 3   sertraline  (ZOLOFT ) 25 MG tablet, Take 1 tablet (25 mg total) by mouth daily., Disp: 30 tablet, Rfl: 2   topiramate  (TOPAMAX ) 25 MG tablet, Take 25mg  once daily for one week, then increase to 50mg  once daily, then 75mg  once daily and continue, Disp: 90 tablet, Rfl: 2  "

## 2024-07-07 ENCOUNTER — Ambulatory Visit: Admitting: Physician Assistant

## 2024-07-07 ENCOUNTER — Other Ambulatory Visit: Payer: Self-pay

## 2024-07-07 NOTE — Telephone Encounter (Signed)
 Late entry. Pt was seen 1/9 and does not need forms filled out she will have Neurology fill them out.Forms returned to pt.

## 2024-07-11 ENCOUNTER — Encounter: Payer: Self-pay | Admitting: Physician Assistant

## 2024-07-25 ENCOUNTER — Other Ambulatory Visit: Payer: Self-pay | Admitting: Physician Assistant

## 2024-07-25 ENCOUNTER — Other Ambulatory Visit: Payer: Self-pay

## 2024-07-25 MED ORDER — ATOMOXETINE HCL 25 MG PO CAPS
25.0000 mg | ORAL_CAPSULE | Freq: Every day | ORAL | 0 refills | Status: AC
  Filled 2024-07-25: qty 30, 30d supply, fill #0

## 2024-08-04 ENCOUNTER — Ambulatory Visit: Admitting: Sleep Medicine
